# Patient Record
Sex: Male | Born: 1945 | Race: White | Hispanic: No | Marital: Married | Smoking: Never smoker
Health system: Southern US, Community
[De-identification: ages and names within clinical notes are randomized; demographics above are authoritative.]

## PROBLEM LIST (undated history)

## (undated) DIAGNOSIS — E785 Hyperlipidemia, unspecified: Secondary | ICD-10-CM

## (undated) DIAGNOSIS — K219 Gastro-esophageal reflux disease without esophagitis: Secondary | ICD-10-CM

## (undated) DIAGNOSIS — N529 Male erectile dysfunction, unspecified: Secondary | ICD-10-CM

## (undated) DIAGNOSIS — N400 Enlarged prostate without lower urinary tract symptoms: Secondary | ICD-10-CM

## (undated) DIAGNOSIS — I10 Essential (primary) hypertension: Secondary | ICD-10-CM

## (undated) DIAGNOSIS — E119 Type 2 diabetes mellitus without complications: Secondary | ICD-10-CM

## (undated) HISTORY — DX: Benign prostatic hyperplasia without lower urinary tract symptoms: N40.0

## (undated) HISTORY — DX: Hyperlipidemia, unspecified: E78.5

## (undated) HISTORY — DX: Type 2 diabetes mellitus without complications: E11.9

## (undated) HISTORY — DX: Gastro-esophageal reflux disease without esophagitis: K21.9

## (undated) HISTORY — DX: Essential (primary) hypertension: I10

## (undated) HISTORY — DX: Male erectile dysfunction, unspecified: N52.9

---

## 2013-05-02 ENCOUNTER — Ambulatory Visit: Payer: Self-pay

## 2013-05-09 ENCOUNTER — Ambulatory Visit: Payer: Self-pay

## 2013-05-16 ENCOUNTER — Ambulatory Visit: Payer: Self-pay

## 2013-05-20 ENCOUNTER — Encounter: Payer: Medicare Other | Attending: Family Medicine | Admitting: *Deleted

## 2013-05-20 ENCOUNTER — Encounter: Payer: Self-pay | Admitting: *Deleted

## 2013-05-20 VITALS — Ht 70.0 in | Wt 208.9 lb

## 2013-05-20 DIAGNOSIS — E119 Type 2 diabetes mellitus without complications: Secondary | ICD-10-CM | POA: Insufficient documentation

## 2013-05-20 DIAGNOSIS — IMO0001 Reserved for inherently not codable concepts without codable children: Secondary | ICD-10-CM

## 2013-05-20 DIAGNOSIS — Z713 Dietary counseling and surveillance: Secondary | ICD-10-CM | POA: Insufficient documentation

## 2013-05-20 NOTE — Patient Instructions (Signed)
Plan:  Aim for 3 Carb Choices per meal (45 grams) +/- 1 either way  Aim for 0-2 Carbs per snack if hungry  Consider reading food labels for Total Carbohydrate of foods Consider  increasing your activity level for 15-30 minutes daily as tolerated Continue checking BG 1- 2 times a week and consider after meals on occasion

## 2013-05-20 NOTE — Progress Notes (Signed)
Appt start time: 0930 end time:  1100.  Assessment:  Patient was seen on  05/20/13 for individual diabetes education. Strong family history for diabetes from both parents. 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 Patient's goal weight is 190 pounds. He is working in [place] and works 7 AM to 7 PM Monday through Friday. He prepares his own meals during the week and comes home to [place] on weekends.  Current HbA1c: 6.5% on 01/13/14  Preferred Learning Style: all of the following  Auditory  Visual  Hands on  Learning Readiness:   Ready  Change in progress  MEDICATIONS: see list  DIETARY INTAKE:  24-hr recall:  B ( AM): when working out of town: large bowl of unsweet cereal, 2% milk and coffee with half and half. When at home: eggs, [place] sausage, occasionally french toast, is avoiding OJ now,  Snk ( AM): Velveeta crackers, coffee and half and half L ( PM): 15 grain bread for a sandwich with lean meat, swiss cheese and mustard, sliced tomato, Crystal LIght Snk ( PM): fresh fruit D ( PM): when working out of town: [employer] whole can, 8-12 triscuits, swiss cheese, OR full meal when back at home with lean meat, vegetables and starch along with salads and low fat dressing, wine with dinner 3-4 nights a week or Crystal Light Snk ( PM): he makes sugar free pudding x 8 oz Beverages: coffee, Crystal Light and wine  Usual physical activity: 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  Discussed diabetes disease process and treatment options.  Discussed physiology of diabetes and role of obesity on insulin resistance.  Encouraged moderate weight reduction to improve glucose levels.  Discussed role of medications and diet in glucose control  Provided education on macronutrients on glucose levels.  Provided  education on carb counting, importance of regularly scheduled meals/snacks, and meal planning  Discussed effects of physical activity on glucose levels and long-term glucose control.  Recommended he consider options of physical activity/week.  Discussed blood glucose monitoring and interpretation.  Discussed recommended target ranges and individual ranges.    Described short-term complications: hyper- and hypo-glycemia.  Discussed causes,symptoms, and treatment options.  Discussed prevention, detection, and treatment of long-term complications.  Discussed the role of prolonged elevated glucose levels on body systems.  Discussed role of stress on blood glucose levels and discussed strategies to manage psychosocial issues.  Provided information on recommendations for long-term diabetes self-care.  Established checklist for medical, dental, and emotional self-care.  Plan:  Aim for 3 Carb Choices per meal (45 grams) +/- 1 either way  Aim for 0-2 Carbs per snack if hungry  Consider reading food labels for Total Carbohydrate of foods Consider  increasing your activity level for 15-30 minutes daily as tolerated Continue checking BG 1- 2 times a week and consider after meals on occasion   Teaching Method Utilized: Visual, Auditory and Hands on  Handouts given during visit include: Living Well with Diabetes Carb Counting and Food Label handouts Meal Plan Card  Barriers to learning/adherence to lifestyle change: strict work schedule right now  Diabetes self-care support plan:   [facility] support group  Demonstrated degree of understanding via:  Teach Back   Monitoring/Evaluation:  Dietary intake, exercise, reading food labels, and body weight prn.

## 2019-05-09 ENCOUNTER — Other Ambulatory Visit: Payer: Self-pay

## 2019-05-09 ENCOUNTER — Ambulatory Visit: Payer: Medicare Other | Attending: Internal Medicine

## 2019-05-09 DIAGNOSIS — Z23 Encounter for immunization: Secondary | ICD-10-CM

## 2019-05-09 NOTE — Progress Notes (Signed)
   Covid-19 Vaccination Clinic  Name:  Wesley Matthews    MRN: [id number] DOB: 10/23/1945  05/09/2019  Wesley Matthews was observed post Covid-19 immunization for 15 minutes without incidence. He was provided with Vaccine Information Sheet and instruction to access the V-Safe system.   Wesley Matthews was instructed to call 911 with any severe reactions post vaccine: Marland Kitchen Difficulty breathing  . Swelling of your face and throat  . A fast heartbeat  . A bad rash all over your body  . Dizziness and weakness    Immunizations Administered    Name Date Dose VIS Date Route   Pfizer COVID-19 Vaccine 05/09/2019  5:30 PM 0.3 mL 03/29/2019 Intramuscular   Manufacturer: [place]   Lot: [id number]   [place]: [id number]

## 2019-05-30 ENCOUNTER — Ambulatory Visit: Payer: Medicare Other | Attending: Internal Medicine

## 2019-05-30 DIAGNOSIS — Z23 Encounter for immunization: Secondary | ICD-10-CM

## 2019-05-30 NOTE — Progress Notes (Signed)
   Covid-19 Vaccination Clinic  Name:  Wesley Matthews    MRN: [id number] DOB: 02/17/1946  05/30/2019  Wesley Matthews was observed post Covid-19 immunization for 15 minutes without incidence. He was provided with Vaccine Information Sheet and instruction to access the V-Safe system.   Wesley Matthews was instructed to call 911 with any severe reactions post vaccine: Marland Kitchen Difficulty breathing  . Swelling of your face and throat  . A fast heartbeat  . A bad rash all over your body  . Dizziness and weakness    Immunizations Administered    Name Date Dose VIS Date Route   Pfizer COVID-19 Vaccine 05/30/2019  8:45 AM 0.3 mL 03/29/2019 Intramuscular   Manufacturer: [place]   Lot: [id number]   [place] [id number]

## 2020-05-04 DIAGNOSIS — I10 Essential (primary) hypertension: Secondary | ICD-10-CM | POA: Diagnosis not present

## 2020-05-04 DIAGNOSIS — E1169 Type 2 diabetes mellitus with other specified complication: Secondary | ICD-10-CM | POA: Diagnosis not present

## 2020-05-04 DIAGNOSIS — E781 Pure hyperglyceridemia: Secondary | ICD-10-CM | POA: Diagnosis not present

## 2020-05-04 DIAGNOSIS — K219 Gastro-esophageal reflux disease without esophagitis: Secondary | ICD-10-CM | POA: Diagnosis not present

## 2020-05-04 DIAGNOSIS — E1140 Type 2 diabetes mellitus with diabetic neuropathy, unspecified: Secondary | ICD-10-CM | POA: Diagnosis not present

## 2020-05-08 DIAGNOSIS — R000 Tachycardia, unspecified: Secondary | ICD-10-CM | POA: Diagnosis not present

## 2020-05-11 NOTE — Progress Notes (Signed)
Cardiology Office Note:   Date:  05/12/2020  NAME:  Wesley Matthews    MRN: [id number] DOB:  18-Aug-1945   PCP:  Kathyrn Lass, MD  Cardiologist:  No primary care provider on file.   Referring MD: Kathyrn Lass, MD   Chief Complaint  Patient presents with  . Tachycardia   History of Present Illness:   Marshall Kahekili Gorbett is a 75 y.o. male with a hx of DM, HTN who is being seen today for the evaluation of tachycardia/palpitations at the request of Kathyrn Lass, MD.  He recently noticed his heart rate was fast.  He apparently picked this up by checking his blood pressure.  He also checks his pulse.  Pulse has been consistently around 150.  He was seen by his primary care physician and sent for evaluation.  EKG shows atrial flutter with RVR up to 149 bpm.  He has a right bundle branch block pattern as well.  He has no symptoms from this.  No chest pain or shortness of breath or palpitations.  He reports he exercise walking 2 miles daily up to 4 to 5 days/week.  He can play golf any limitations either.  Medical history significant for diabetes which is well controlled.  Most recent LDL cholesterol 40.  Blood pressure has been a bit low well-controlled on current medications.  He denies any chest pain, shortness of breath or palpitations in office.  Medical history is significant for heart disease in both parents.  They were heavy smokers.  He has never had a heart attack or stroke.  No difficulty swallowing.  We reported that he needed a transesophageal echocardiogram and cardioversion.  He is okay to proceed.  He does need updated blood work.  He does not smoke, drink alcohol in excess or use any drugs.  He denies symptoms in office today.  Problem List 1. Diabetes -A1c 6.8 2. Hypertension 3. HLD -Total cholesterol 110, HDL 55, LDL 40, triglycerides 70. 4. Atrial Flutter -typical 05/12/2020 -CHADSVASC= 3 (age, DM, HTN)  Past Medical History: Past Medical History:  Diagnosis Date  . BPH  (benign prostatic hyperplasia)   . Diabetes mellitus without complication ([place])   . ED (erectile dysfunction)   . GERD (gastroesophageal reflux disease)   . Hyperlipidemia   . Hypertension     Past Surgical History: Past Surgical History:  Procedure Laterality Date  . 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 apixaban (ELIQUIS) 5 MG TABS tablet Take 1 tablet (5 mg total) by mouth 2 (two) times daily.  Marland Kitchen atorvastatin (LIPITOR) 10 MG tablet Take 10 mg by mouth daily.  . fluticasone (FLONASE) 50 MCG/ACT nasal spray Place into both nostrils.  . Gluc-Chonn-MSM-Boswellia-Vit D (GLUCOSAMINE CHOND TRIPLE/VIT D) TABS See admin instructions.  Marland Kitchen JARDIANCE 10 MG TABS tablet Take 10 mg by mouth daily.  . Lancets (ONETOUCH ULTRASOFT) lancets   . levocetirizine (XYZAL) 5 MG tablet every evening.  Marland Kitchen losartan (COZAAR) 25 MG tablet Take 25 mg by mouth daily.  . metFORMIN (GLUCOPHAGE) 500 MG tablet Take 1,000 mg by mouth 2 (two) times daily.  . metoprolol tartrate (LOPRESSOR) 25 MG tablet Take 1 tablet (25 mg total) by mouth 2 (two) times daily.  Marland Kitchen omeprazole (PRILOSEC) 20 MG capsule Take 20 mg by mouth daily.  . sildenafil (VIAGRA) 100 MG tablet Take by mouth.  . [DISCONTINUED] amLODipine (NORVASC) 5 MG tablet Take 5 mg by mouth daily.  . [  DISCONTINUED] aspirin 81 MG tablet Take 81 mg by mouth daily.  . [DISCONTINUED] azelastine (ASTELIN) 137 MCG/SPRAY nasal spray Place into both nostrils 2 (two) times daily. Use in each nostril as directed  . [DISCONTINUED] fenofibrate 160 MG tablet Take 160 mg by mouth daily.  . [DISCONTINUED] ranitidine (ZANTAC) 300 MG capsule Take 300 mg by mouth every evening.     Allergies:    Patient has no known allergies.   Social History: Social History   Socioeconomic History  . Marital status: Married    Spouse name: Not on file  . Number of children: 3  . Years of education: Not on file  . Highest education  level: Not on file  Occupational History  . Occupation: Arts administrator  Tobacco Use  . Smoking status: Never Smoker  . Smokeless tobacco: Never Used  Substance and Sexual Activity  . Alcohol use: Yes    Comment: 3-4 glasses wine / week  . Drug use: Never  . Sexual activity: Not on file  Other Topics Concern  . Not on file  Social History Narrative  . Not on file   Social Determinants of Health   Financial Resource Strain: Not on file  Food Insecurity: Not on file  Transportation Needs: Not on file  Physical Activity: Not on file  Stress: Not on file  Social Connections: Not on file    Family History: The patient's family history includes Heart disease in his father and mother.  ROS:   All other ROS reviewed and negative. Pertinent positives noted in the HPI.     EKGs/Labs/Other Studies Reviewed:   The following studies were personally reviewed by me today:  EKG:  EKG is ordered today.  The ekg ordered today demonstrates atrial flutter with 2-1 block, right bundle branch block, and was personally reviewed by me.   Recent Labs: No results found for requested labs within last 8760 hours.   Recent Lipid Panel No results found for: CHOL, TRIG, HDL, CHOLHDL, VLDL, LDLCALC, LDLDIRECT  Physical Exam:   VS:  BP 115/71   Pulse (!) 149   Ht 5\' 10"  (1.778 m)   Wt 190 lb 9.6 oz (86.5 kg)   SpO2 97%   BMI 27.35 kg/m    Wt Readings from Last 3 Encounters:  05/12/20 190 lb 9.6 oz (86.5 kg)  05/20/13 208 lb 14.4 oz (94.8 kg)    General: Well nourished, well developed, in no acute distress Head: Atraumatic, normal size  Eyes: PEERLA, EOMI  Neck: Supple, no JVD Endocrine: No thryomegaly Cardiac: Normal S1, S2; tachycardia noted, regular rhythm neck Lungs: Clear to auscultation bilaterally, no wheezing, rhonchi or rales  Abd: Soft, nontender, no hepatomegaly  Ext: No edema, pulses 2+ Musculoskeletal: No deformities, BUE and BLE strength normal and equal Skin: Warm  and dry, no rashes   Neuro: Alert and oriented to person, place, time, and situation, CNII-XII grossly intact, no focal deficits  Psych: Normal mood and affect   ASSESSMENT:   Wesley Matthews is a 75 y.o. male who presents for the following: 1. Palpitations   2. Tachycardia   3. Typical atrial flutter (HCC)     PLAN:   1. Palpitations 2. Tachycardia 3. Typical atrial flutter ([place]) -He presents with asymptomatic atrial flutter with 2:1 block.  Heart rate 149.  Unclear risk factor. -He needs a CBC, CMP, TSH today. -We will start him on metoprolol tartrate 25 mg twice a day.  He will stop his losartan. -We will  start Eliquis 5 mg twice daily.  We will plan for TEE/cardioversion on Thursday of this week. -CHADSVASC=3 (age, DM, HTN) -We will proceed with an echocardiogram after his TEE/cardioversion.  I would also like him to get a 2-week Zio patch.  We may consider extended monitoring to look for atrial fibrillation.    Shared Decision Making/Informed Consent The risks [stroke, cardiac arrhythmias rarely resulting in the need for a temporary or permanent pacemaker, skin irritation or burns, esophageal damage, perforation (1:10,000 risk), bleeding, pharyngeal hematoma as well as other potential complications associated with conscious sedation including aspiration, arrhythmia, respiratory failure and death], benefits (treatment guidance, restoration of normal sinus rhythm, diagnostic support) and alternatives of a transesophageal echocardiogram guided cardioversion were discussed in detail with Mr. Karstetter and he is willing to proceed.  Disposition: Return in about 2 weeks (around 05/26/2020).  Medication Adjustments/Labs and Tests Ordered: Current medicines are reviewed at length with the patient today.  Concerns regarding medicines are outlined above.  Orders Placed This Encounter  Procedures  . CBC  . Comprehensive metabolic panel  . TSH  . LONG TERM MONITOR (3-14 DAYS)  . EKG  12-Lead  . ECHOCARDIOGRAM COMPLETE   Meds ordered this encounter  Medications  . metoprolol tartrate (LOPRESSOR) 25 MG tablet    Sig: Take 1 tablet (25 mg total) by mouth 2 (two) times daily.    Dispense:  180 tablet    Refill:  3  . apixaban (ELIQUIS) 5 MG TABS tablet    Sig: Take 1 tablet (5 mg total) by mouth 2 (two) times daily.    Dispense:  60 tablet    Refill:  3    Patient Instructions  Medication Instructions:  Start Metoprolol Tartrate 25 daily twice daily  Stop Losartan  Start Eliquis 5 mg twice daily   *If you need a refill on your cardiac medications before your next appointment, please call your pharmacy*   Lab Work: CBC, CMET, TSH COVID TESTING: tomorrow at 8:30 AM- [address], [place]   If you have labs (blood work) drawn today and your tests are completely normal, you will receive your results only by: Marland Kitchen MyChart Message (if you have MyChart) OR . A paper copy in the mail If you have any lab test that is abnormal or we need to change your treatment, we will call you to review the results.   Testing/Procedures:  Your physician has requested that you have a TEE/Cardioversion. During a TEE, sound waves are used to create images of your heart. It provides your doctor with information about the size and shape of your heart and how well your heart's chambers and valves are working. In this test, a transducer is attached to the end of a flexible tube that is guided down you throat and into your esophagus (the tube leading from your mouth to your stomach) to get a more detailed image of your heart. Once the TEE has determined that a blood clot is not present, the cardioversion begins. Electrical Cardioversion uses a jolt of electricity to your heart either through paddles or wired patches attached to your chest. This is a controlled, usually prescheduled, procedure. This procedure is done at the hospital and you are not awake during the procedure. You  usually go home the day of the procedure. Please see the instruction sheet given to you today for more information.  Echocardiogram - Your physician has requested that you have an echocardiogram. Echocardiography is a painless test that uses  sound waves to create images of your heart. It provides your doctor with information about the size and shape of your heart and how well your heart's chambers and valves are working. This procedure takes approximately one hour. There are no restrictions for this procedure. This will be performed at our [facility] location - [address].  ZIO XT- Long Term Monitor Instructions   Your physician has requested you wear your ZIO patch monitor___14____days.   This is a single patch monitor.  [employer] supplies one patch monitor per enrollment.  Additional stickers are not available.   Please do not apply patch if you will be having a Nuclear Stress Test, Echocardiogram, Cardiac CT, MRI, or Chest Xray during the time frame you would be wearing the monitor. The patch cannot be worn during these tests.  You cannot remove and re-apply the ZIO XT patch monitor.   Your ZIO patch monitor will be sent USPS Priority mail from [employer] directly to your home address. The monitor may also be mailed to a PO BOX if home delivery is not available.   It may take 3-5 days to receive your monitor after you have been enrolled.   Once you have received you monitor, please review enclosed instructions.  Your monitor has already been registered assigning a specific monitor serial # to you.   Applying the monitor   Shave hair from upper left chest.   Hold abrader disc by orange tab.  Rub abrader in 40 strokes over left upper chest as indicated in your monitor instructions.   Clean area with 4 enclosed alcohol pads .  Use all pads to assure are is cleaned thoroughly.  Let dry.   Apply patch as indicated in monitor instructions.  Patch will be place under  collarbone on left side of chest with arrow pointing upward.   Rub patch adhesive wings for 2 minutes.Remove white label marked "1".  Remove white label marked "2".  Rub patch adhesive wings for 2 additional minutes.   While looking in a mirror, press and release button in center of patch.  A small green light will flash 3-4 times .  This will be your only indicator the monitor has been turned on.     Do not shower for the first 24 hours.  You may shower after the first 24 hours.   Press button if you feel a symptom. You will hear a small click.  Record Date, Time and Symptom in the Patient Log Book.   When you are ready to remove patch, follow instructions on last 2 pages of Patient Log Book.  Stick patch monitor onto last page of Patient Log Book.   Place Patient Log Book in [place] box.  Use locking tab on box and tape box closed securely.  The [employer] has [employer] on it.  Please place in mailbox as soon as possible.  Your physician should have your test results approximately 7 days after the monitor has been mailed back to [facility]   Call [place] at [phone] if you have questions regarding your ZIO XT patch monitor.  Call them immediately if you see an orange light blinking on your monitor.   If your monitor falls off in less than 4 days contact our Monitor department at [phone].  If your monitor becomes loose or falls off after 4 days call [employer] at [phone] for suggestions on securing your monitor.     Follow-Up:  At [facility], you and your health needs are our priority.  As part of our continuing mission to provide you with exceptional heart care, we have created designated Provider Care Teams.  These Care Teams include your primary Cardiologist (physician) and Advanced Practice Providers (APPs -  Physician Assistants and Nurse Practitioners) who all work together to provide you with the care you need, when you need  it.  We recommend signing up for the patient portal called "MyChart".  Sign up information is provided on this After Visit Summary.  MyChart is used to connect with patients for Virtual Visits (Telemedicine).  Patients are able to view lab/test results, encounter notes, upcoming appointments, etc.  Non-urgent messages can be sent to your provider as well.   To learn more about what you can do with MyChart, go to [email or web address].    Your next appointment:   2 week(s)  The format for your next appointment:   In Person  Provider:   Eleonore Chiquito, MD   Other Instructions  You are scheduled for a TEE/Cardioversion/TEE Cardioversion on 01/27 with Dr. Stanford Breed.  Please arrive at the [facility] (Main Entrance A) at [facility]: [address] [place], [place] [address] at 8:00 am. (1 hour prior to procedure unless lab work is needed; if lab work is needed arrive 1.5 hours ahead)  DIET: Nothing to eat or drink after midnight except a sip of water with medications (see medication instructions below)  Medication Instructions: Hold Jardiance, and Metformin the day of Tee/Cardioversion   Continue your anticoagulant: Eliquis You will need to continue your anticoagulant after your procedure until you  are told by your  Provider that it is safe to stop   Labs: CBC, BMET today   You must have a responsible person to drive you home and stay in the waiting area during your procedure. Failure to do so could result in cancellation.  Bring your insurance cards.  *Special Note: Every effort is made to have your procedure done on time. Occasionally there are emergencies that occur at the hospital that may cause delays. Please be patient if a delay does occur.       Signed, Addison Naegeli. Audie Box, [place]  [address] [place], [place] [address] [phone]  05/12/2020 3:40 PM

## 2020-05-12 ENCOUNTER — Ambulatory Visit: Payer: Medicare Other | Admitting: Cardiovascular Disease

## 2020-05-12 ENCOUNTER — Ambulatory Visit (INDEPENDENT_AMBULATORY_CARE_PROVIDER_SITE_OTHER): Payer: Medicare Other

## 2020-05-12 ENCOUNTER — Encounter: Payer: Self-pay | Admitting: Cardiovascular Disease

## 2020-05-12 ENCOUNTER — Other Ambulatory Visit: Payer: Self-pay

## 2020-05-12 VITALS — BP 115/71 | HR 149 | Ht 70.0 in | Wt 190.6 lb

## 2020-05-12 DIAGNOSIS — R000 Tachycardia, unspecified: Secondary | ICD-10-CM

## 2020-05-12 DIAGNOSIS — R002 Palpitations: Secondary | ICD-10-CM

## 2020-05-12 DIAGNOSIS — I483 Typical atrial flutter: Secondary | ICD-10-CM | POA: Diagnosis not present

## 2020-05-12 MED ORDER — APIXABAN 5 MG PO TABS: 5.0000 mg | ORAL_TABLET | Freq: Two times a day (BID) | ORAL | 3 refills | Status: DC

## 2020-05-12 MED ORDER — METOPROLOL TARTRATE 25 MG PO TABS: 25.0000 mg | ORAL_TABLET | Freq: Two times a day (BID) | ORAL | 3 refills | Status: DC

## 2020-05-12 NOTE — Patient Instructions (Signed)
Medication Instructions:  Start Metoprolol Tartrate 25 daily twice daily  Stop Losartan  Start Eliquis 5 mg twice daily   *If you need a refill on your cardiac medications before your next appointment, please call your pharmacy*   Lab Work: CBC, CMET, TSH COVID TESTING: tomorrow at 8:30 AM- [place] [address], [place]   If you have labs (blood work) drawn today and your tests are completely normal, you will receive your results only by: Marland Kitchen MyChart Message (if you have MyChart) OR . A paper copy in the mail If you have any lab test that is abnormal or we need to change your treatment, we will call you to review the results.   Testing/Procedures:  Your physician has requested that you have a TEE/Cardioversion. During a TEE, sound waves are used to create images of your heart. It provides your doctor with information about the size and shape of your heart and how well your heart's chambers and valves are working. In this test, a transducer is attached to the end of a flexible tube that is guided down you throat and into your esophagus (the tube leading from your mouth to your stomach) to get a more detailed image of your heart. Once the TEE has determined that a blood clot is not present, the cardioversion begins. Electrical Cardioversion uses a jolt of electricity to your heart either through paddles or wired patches attached to your chest. This is a controlled, usually prescheduled, procedure. This procedure is done at the hospital and you are not awake during the procedure. You usually go home the day of the procedure. Please see the instruction sheet given to you today for more information.  Echocardiogram - Your physician has requested that you have an echocardiogram. Echocardiography is a painless test that uses sound waves to create images of your heart. It provides your doctor with information about the size and shape of your heart and how well your heart's chambers and valves are  working. This procedure takes approximately one hour. There are no restrictions for this procedure. This will be performed at our [facility] location - [address].  ZIO XT- Long Term Monitor Instructions   Your physician has requested you wear your ZIO patch monitor___14____days.   This is a single patch monitor.  [employer] supplies one patch monitor per enrollment.  Additional stickers are not available.   Please do not apply patch if you will be having a Nuclear Stress Test, Echocardiogram, Cardiac CT, MRI, or Chest Xray during the time frame you would be wearing the monitor. The patch cannot be worn during these tests.  You cannot remove and re-apply the ZIO XT patch monitor.   Your ZIO patch monitor will be sent USPS Priority mail from [facility] directly to your home address. The monitor may also be mailed to a PO BOX if home delivery is not available.   It may take 3-5 days to receive your monitor after you have been enrolled.   Once you have received you monitor, please review enclosed instructions.  Your monitor has already been registered assigning a specific monitor serial # to you.   Applying the monitor   Shave hair from upper left chest.   Hold abrader disc by orange tab.  Rub abrader in 40 strokes over left upper chest as indicated in your monitor instructions.   Clean area with 4 enclosed alcohol pads .  Use all pads to assure are is cleaned thoroughly.  Let dry.   Apply patch as indicated in monitor instructions.  Patch will be place under collarbone on left side of chest with arrow pointing upward.   Rub patch adhesive wings for 2 minutes.Remove white label marked "1".  Remove white label marked "2".  Rub patch adhesive wings for 2 additional minutes.   While looking in a mirror, press and release button in center of patch.  A small green light will flash 3-4 times .  This will be your only indicator the monitor has been turned on.     Do not  shower for the first 24 hours.  You may shower after the first 24 hours.   Press button if you feel a symptom. You will hear a small click.  Record Date, Time and Symptom in the Patient Log Book.   When you are ready to remove patch, follow instructions on last 2 pages of Patient Log Book.  Stick patch monitor onto last page of Patient Log Book.   Place Patient Log Book in [place] box.  Use locking tab on box and tape box closed securely.  The [employer] has [employer] on it.  Please place in mailbox as soon as possible.  Your physician should have your test results approximately 7 days after the monitor has been mailed back to [facility].   Call [place] at [phone] if you have questions regarding your ZIO XT patch monitor.  Call them immediately if you see an orange light blinking on your monitor.   If your monitor falls off in less than 4 days contact our Monitor department at [phone].  If your monitor becomes loose or falls off after 4 days call [employer] at [phone] for suggestions on securing your monitor.     Follow-Up: At [facility], you and your health needs are our priority.  As part of our continuing mission to provide you with exceptional heart care, we have created designated Provider Care Teams.  These Care Teams include your primary Cardiologist (physician) and Advanced Practice Providers (APPs -  Physician Assistants and Nurse Practitioners) who all work together to provide you with the care you need, when you need it.  We recommend signing up for the patient portal called "MyChart".  Sign up information is provided on this After Visit Summary.  MyChart is used to connect with patients for Virtual Visits (Telemedicine).  Patients are able to view lab/test results, encounter notes, upcoming appointments, etc.  Non-urgent messages can be sent to your provider as well.   To learn more about what you can do with MyChart, go to  [email or web address].    Your next appointment:   2 week(s)  The format for your next appointment:   In Person  Provider:   Eleonore Chiquito, MD   Other Instructions  You are scheduled for a TEE/Cardioversion/TEE Cardioversion on 01/27 with Dr. Stanford Breed.  Please arrive at the [facility] (Main Entrance A) at [facility]: [address] [place], [place] [address] at 8:00 am. (1 hour prior to procedure unless lab work is needed; if lab work is needed arrive 1.5 hours ahead)  DIET: Nothing to eat or drink after midnight except a sip of water with medications (see medication instructions below)  Medication Instructions: Hold Jardiance, and Metformin the day of Tee/Cardioversion   Continue your anticoagulant: Eliquis You will need to continue your anticoagulant after your procedure until you  are told by your  Provider that it is safe  to stop   Labs: CBC, BMET today   You must have a responsible person to drive you home and stay in the waiting area during your procedure. Failure to do so could result in cancellation.  Bring your insurance cards.  *Special Note: Every effort is made to have your procedure done on time. Occasionally there are emergencies that occur at the hospital that may cause delays. Please be patient if a delay does occur.

## 2020-05-13 ENCOUNTER — Other Ambulatory Visit (HOSPITAL_COMMUNITY)
Admission: RE | Admit: 2020-05-13 | Discharge: 2020-05-13 | Disposition: A | Discharge status: Home / Self Care | Payer: Medicare Other | Source: Ambulatory Visit | Attending: Cardiovascular Disease | Admitting: Cardiovascular Disease

## 2020-05-13 ENCOUNTER — Encounter (HOSPITAL_COMMUNITY): Payer: Self-pay | Admitting: Cardiology

## 2020-05-13 DIAGNOSIS — Z01812 Encounter for preprocedural laboratory examination: Secondary | ICD-10-CM | POA: Insufficient documentation

## 2020-05-13 DIAGNOSIS — Z20822 Contact with and (suspected) exposure to covid-19: Secondary | ICD-10-CM | POA: Insufficient documentation

## 2020-05-13 LAB — COMPREHENSIVE METABOLIC PANEL
ALT: 20 IU/L (ref 0–44)
AST: 19 IU/L (ref 0–40)
Albumin/Globulin Ratio: 1.6 (ref 1.2–2.2)
Albumin: 4.8 g/dL — ABNORMAL HIGH (ref 3.7–4.7)
Alkaline Phosphatase: 72 IU/L (ref 44–121)
BUN/Creatinine Ratio: 20 (ref 10–24)
BUN: 19 mg/dL (ref 8–27)
Bilirubin Total: 0.3 mg/dL (ref 0.0–1.2)
CO2: 25 mmol/L (ref 20–29)
Calcium: 10.2 mg/dL (ref 8.6–10.2)
Chloride: 100 mmol/L (ref 96–106)
Creatinine, Ser: 0.96 mg/dL (ref 0.76–1.27)
GFR calc Af Amer: 90 mL/min/{1.73_m2} (ref >59)
GFR calc non Af Amer: 78 mL/min/{1.73_m2} (ref >59)
Globulin, Total: 3 g/dL (ref 1.5–4.5)
Glucose: 108 mg/dL — ABNORMAL HIGH (ref 65–99)
Potassium: 5.1 mmol/L (ref 3.5–5.2)
Sodium: 140 mmol/L (ref 134–144)
Total Protein: 7.8 g/dL (ref 6.0–8.5)

## 2020-05-13 LAB — CBC
Hematocrit: 48.5 % (ref 37.5–51.0)
Hemoglobin: 16.2 g/dL (ref 13.0–17.7)
MCH: 29.8 pg (ref 26.6–33.0)
MCHC: 33.4 g/dL (ref 31.5–35.7)
MCV: 89 fL (ref 79–97)
Platelets: 267 10*3/uL (ref 150–450)
RBC: 5.43 x10E6/uL (ref 4.14–5.80)
RDW: 12.9 % (ref 11.6–15.4)
WBC: 5.7 10*3/uL (ref 3.4–10.8)

## 2020-05-13 LAB — TSH: TSH: 3.97 u[IU]/mL (ref 0.450–4.500)

## 2020-05-13 LAB — SARS CORONAVIRUS 2 (TAT 6-24 HRS): SARS Coronavirus 2: NEGATIVE

## 2020-05-14 ENCOUNTER — Encounter (HOSPITAL_COMMUNITY): Payer: Self-pay | Admitting: Cardiology

## 2020-05-14 ENCOUNTER — Other Ambulatory Visit: Payer: Self-pay

## 2020-05-14 ENCOUNTER — Encounter (HOSPITAL_COMMUNITY)
Admission: RE | Disposition: A | Discharge status: Home / Self Care | Payer: Self-pay | Source: Home / Self Care | Attending: Cardiology

## 2020-05-14 ENCOUNTER — Ambulatory Visit (HOSPITAL_COMMUNITY): Payer: Medicare Other | Admitting: Certified Registered Nurse Anesthetist

## 2020-05-14 ENCOUNTER — Ambulatory Visit (HOSPITAL_COMMUNITY)
Admission: RE | Admit: 2020-05-14 | Discharge: 2020-05-14 | Disposition: A | Discharge status: Home / Self Care | Payer: Medicare Other | Attending: Cardiology | Admitting: Cardiology

## 2020-05-14 ENCOUNTER — Ambulatory Visit (HOSPITAL_BASED_OUTPATIENT_CLINIC_OR_DEPARTMENT_OTHER)
Admission: RE | Admit: 2020-05-14 | Discharge: 2020-05-14 | Disposition: A | Discharge status: Home / Self Care | Payer: Medicare Other | Source: Home / Self Care | Attending: Cardiovascular Disease | Admitting: Cardiovascular Disease

## 2020-05-14 DIAGNOSIS — Z79899 Other long term (current) drug therapy: Secondary | ICD-10-CM | POA: Insufficient documentation

## 2020-05-14 DIAGNOSIS — I700 Atherosclerosis of aorta: Secondary | ICD-10-CM | POA: Insufficient documentation

## 2020-05-14 DIAGNOSIS — I340 Nonrheumatic mitral (valve) insufficiency: Secondary | ICD-10-CM

## 2020-05-14 DIAGNOSIS — R002 Palpitations: Secondary | ICD-10-CM | POA: Diagnosis not present

## 2020-05-14 DIAGNOSIS — I4892 Unspecified atrial flutter: Secondary | ICD-10-CM

## 2020-05-14 DIAGNOSIS — R000 Tachycardia, unspecified: Secondary | ICD-10-CM | POA: Diagnosis not present

## 2020-05-14 DIAGNOSIS — Z7901 Long term (current) use of anticoagulants: Secondary | ICD-10-CM | POA: Insufficient documentation

## 2020-05-14 DIAGNOSIS — Z7984 Long term (current) use of oral hypoglycemic drugs: Secondary | ICD-10-CM | POA: Insufficient documentation

## 2020-05-14 DIAGNOSIS — E119 Type 2 diabetes mellitus without complications: Secondary | ICD-10-CM | POA: Diagnosis not present

## 2020-05-14 DIAGNOSIS — I10 Essential (primary) hypertension: Secondary | ICD-10-CM | POA: Insufficient documentation

## 2020-05-14 DIAGNOSIS — I4891 Unspecified atrial fibrillation: Secondary | ICD-10-CM | POA: Diagnosis not present

## 2020-05-14 HISTORY — PX: TEE WITHOUT CARDIOVERSION: SHX5443

## 2020-05-14 HISTORY — PX: CARDIOVERSION: SHX1299

## 2020-05-14 LAB — GLUCOSE, CAPILLARY: Glucose-Capillary: 112 mg/dL — ABNORMAL HIGH (ref 70–99)

## 2020-05-14 SURGERY — ECHOCARDIOGRAM, TRANSESOPHAGEAL
Anesthesia: Monitor Anesthesia Care

## 2020-05-14 MED ORDER — PHENYLEPHRINE 40 MCG/ML (10ML) SYRINGE FOR IV PUSH (FOR BLOOD PRESSURE SUPPORT)
PREFILLED_SYRINGE | INTRAVENOUS | Status: DC | PRN
  Administered 2020-05-14 (×2): 80 ug via INTRAVENOUS
  Administered 2020-05-14: 120 ug via INTRAVENOUS
  Administered 2020-05-14: 80 ug via INTRAVENOUS

## 2020-05-14 MED ORDER — PROPOFOL 500 MG/50ML IV EMUL
INTRAVENOUS | Status: DC | PRN
  Administered 2020-05-14: 75 ug/kg/min via INTRAVENOUS

## 2020-05-14 MED ORDER — PROPOFOL 10 MG/ML IV BOLUS
INTRAVENOUS | Status: DC | PRN
  Administered 2020-05-14 (×3): 20 mg via INTRAVENOUS

## 2020-05-14 MED ORDER — LIDOCAINE 2% (20 MG/ML) 5 ML SYRINGE
INTRAMUSCULAR | Status: DC | PRN
  Administered 2020-05-14: 60 mg via INTRAVENOUS

## 2020-05-14 MED ORDER — SODIUM CHLORIDE 0.9 % IV SOLN: INTRAVENOUS | Status: DC

## 2020-05-14 NOTE — Progress Notes (Signed)
  Echocardiogram 2D Echocardiogram has been performed.  Jennette Dubin 05/14/2020, 9:57 AM

## 2020-05-14 NOTE — Anesthesia Preprocedure Evaluation (Signed)
Anesthesia Evaluation  Patient identified by MRN, date of birth, ID band Patient awake    Reviewed: Allergy & Precautions, NPO status , Patient's Chart, lab work & pertinent test results, reviewed documented beta blocker date and time   Airway Mallampati: II  TM Distance: >3 FB Neck ROM: Full    Dental  (+) Teeth Intact   Pulmonary neg pulmonary ROS,    Pulmonary exam normal        Cardiovascular hypertension, Pt. on medications and Pt. on home beta blockers + dysrhythmias Atrial Fibrillation  Rhythm:Irregular Rate:Normal     Neuro/Psych negative neurological ROS  negative psych ROS   GI/Hepatic Neg liver ROS, GERD  Medicated and Controlled,  Endo/Other  diabetes, Well Controlled, Type 2, Oral Hypoglycemic Agents  Renal/GU negative Renal ROS   BPH    Musculoskeletal negative musculoskeletal ROS (+)   Abdominal (+)  Abdomen: soft. Bowel sounds: normal.  Peds  Hematology negative hematology ROS (+)   Anesthesia Other Findings   Reproductive/Obstetrics                             Anesthesia Physical Anesthesia Plan  ASA: III  Anesthesia Plan: General   Post-op Pain Management:    Induction: Intravenous  PONV Risk Score and Plan: 2 and Propofol infusion and Treatment may vary due to age or medical condition  Airway Management Planned: Simple Face Mask, Natural Airway and Nasal Cannula  Additional Equipment: None  Intra-op Plan:   Post-operative Plan:   Informed Consent: I have reviewed the patients History and Physical, chart, labs and discussed the procedure including the risks, benefits and alternatives for the proposed anesthesia with the patient or authorized representative who has indicated his/her understanding and acceptance.     Dental advisory given  Plan Discussed with: CRNA  Anesthesia Plan Comments: (Lab Results      Component                Value                Date                      WBC                      5.7                 05/12/2020                HGB                      16.2                05/12/2020                HCT                      48.5                05/12/2020                MCV                      89                  05/12/2020  PLT                      267                 05/12/2020           Lab Results      Component                Value               Date                      NA                       140                 05/12/2020                K                        5.1                 05/12/2020                CO2                      25                  05/12/2020                GLUCOSE                  108 (H)             05/12/2020                BUN                      19                  05/12/2020                CREATININE               0.96                05/12/2020                CALCIUM                  10.2                05/12/2020                GFRNONAA                 78                  05/12/2020                GFRAA                    90                  05/12/2020          )        Anesthesia Quick Evaluation

## 2020-05-14 NOTE — Anesthesia Postprocedure Evaluation (Signed)
Anesthesia Post Note  Patient: Carmen Jahmai Finelli  Procedure(s) Performed: TRANSESOPHAGEAL ECHOCARDIOGRAM (TEE) (N/A ) CARDIOVERSION (N/A )     Patient location during evaluation: Endoscopy Anesthesia Type: MAC and General Level of consciousness: awake and alert Pain management: pain level controlled Vital Signs Assessment: post-procedure vital signs reviewed and stable Respiratory status: spontaneous breathing, nonlabored ventilation, respiratory function stable and patient connected to nasal cannula oxygen Cardiovascular status: blood pressure returned to baseline and stable Postop Assessment: no apparent nausea or vomiting Anesthetic complications: no   No complications documented.  Last Vitals:  Vitals:   05/14/20 1010 05/14/20 1018  BP: (!) 88/65 96/61  Pulse: 75 78  Resp: (!) 9 13  Temp:    SpO2: 95% 94%    Last Pain:  Vitals:   05/14/20 1018  TempSrc:   PainSc: 0-No pain                 Belenda Cruise P Rutledge Selsor

## 2020-05-14 NOTE — Interval H&P Note (Signed)
History and Physical Interval Note:  05/14/2020 8:07 AM  Wesley Matthews  has presented today for surgery, with the diagnosis of a fib.  The various methods of treatment have been discussed with the patient and family. After consideration of risks, benefits and other options for treatment, the patient has consented to  Procedure(s): TRANSESOPHAGEAL ECHOCARDIOGRAM (TEE) (N/A) CARDIOVERSION (N/A) as a surgical intervention.  The patient's history has been reviewed, patient examined, no change in status, stable for surgery.  I have reviewed the patient's chart and labs.  Questions were answered to the patient's satisfaction.     Kirk Ruths

## 2020-05-14 NOTE — Progress Notes (Addendum)
    Transesophageal Echocardiogram Note  Wesley Matthews [id number] Aug 28, 1945  Procedure: Transesophageal Echocardiogram Indications: Atrial flutter  Procedure Details Consent: Obtained Time Out: Verified patient identification, verified procedure, site/side was marked, verified correct patient position, special equipment/implants available, Radiology Safety Procedures followed,  medications/allergies/relevent history reviewed, required imaging and test results available.  Performed  Medications:  Pt sedated by anesthesia with lidocaine  60 mg and diprovan  220 mg IV total.  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  Pt subsequently underwent DCCV with 120J to NSR; no immediate complications; continue apixaban.   Complications: No apparent complications Patient did tolerate procedure well.  Kirk Ruths, MD

## 2020-05-14 NOTE — H&P (Signed)
Office Visit  05/12/2020 [facility] Elwin Sleight, MD  Cardiology  Palpitations +2 more  Dx  Tachycardia; Referred by Kathyrn Lass, MD  Reason for Visit    Additional Documentation  Vitals:  BP 115/71  Pulse 149Important   Ht 5\' 10"  (1.778 m)  Wt 86.5 kg  SpO2 97%  BMI 27.35 kg/m  BSA 2.07 m    More Vitals  Flowsheets:  MEWS Score,  Anthropometrics,  NEWS    Encounter Info:  Billing Info,  History,  Allergies,  Detailed Report     All Notes   Progress Notes by Geralynn Rile, MD at 05/12/2020 1:20 PM  Author: Geralynn Rile, MD Author Type: Physician Filed: 05/12/2020 3:41 PM  Note Status: Signed Cosign: Cosign Not Required Encounter Date: 05/12/2020  Editor: Geralynn Rile, MD (Physician)             Expand AllCollapse All    Cardiology Office Note:   Date:  05/12/2020  NAME:  Wesley Matthews                                     MRN:   [id number] DOB:  1945-12-01           PCP:  Kathyrn Lass, MD             Cardiologist:  No primary care provider on file.   Referring MD: Kathyrn Lass, MD      Chief Complaint  Patient presents with  . Tachycardia   History of Present Illness:   Wesley Matthews is a 75 y.o. male with a hx of DM, HTN who is being seen today for the evaluation of tachycardia/palpitations at the request of Kathyrn Lass, MD.  He recently noticed his heart rate was fast.  He apparently picked this up by checking his blood pressure.  He also checks his pulse.  Pulse has been consistently around 150.  He was seen by his primary care physician and sent for evaluation.  EKG shows atrial flutter with RVR up to 149 bpm.  He has a right bundle branch block pattern as well.  He has no symptoms from this.  No chest pain or shortness of breath or palpitations.  He reports he exercise walking 2 miles daily up to 4 to 5 days/week.  He can play golf any limitations either.  Medical history  significant for diabetes which is well controlled.  Most recent LDL cholesterol 40.  Blood pressure has been a bit low well-controlled on current medications.  He denies any chest pain, shortness of breath or palpitations in office.  Medical history is significant for heart disease in both parents.  They were heavy smokers.  He has never had a heart attack or stroke.  No difficulty swallowing.  We reported that he needed a transesophageal echocardiogram and cardioversion.  He is okay to proceed.  He does need updated blood work.  He does not smoke, drink alcohol in excess or use any drugs.  He denies symptoms in office today.  Problem List 1. Diabetes -A1c 6.8 2. Hypertension 3. HLD -Total cholesterol 110, HDL 55, LDL 40, triglycerides 70. 4. Atrial Flutter -typical 05/12/2020 -CHADSVASC= 3 (age, DM, HTN)  Past Medical History:     Past Medical History:  Diagnosis Date  . BPH (benign prostatic hyperplasia)   . Diabetes mellitus without complication ([place])   .  ED (erectile dysfunction)   . GERD (gastroesophageal reflux disease)   . Hyperlipidemia   . Hypertension     Past Surgical History:      Past Surgical History:  Procedure Laterality Date  . 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 apixaban (ELIQUIS) 5 MG TABS tablet Take 1 tablet (5 mg total) by mouth 2 (two) times daily.  Marland Kitchen atorvastatin (LIPITOR) 10 MG tablet Take 10 mg by mouth daily.  . fluticasone (FLONASE) 50 MCG/ACT nasal spray Place into both nostrils.  . Gluc-Chonn-MSM-Boswellia-Vit D (GLUCOSAMINE CHOND TRIPLE/VIT D) TABS See admin instructions.  Marland Kitchen JARDIANCE 10 MG TABS tablet Take 10 mg by mouth daily.  . Lancets (ONETOUCH ULTRASOFT) lancets   . levocetirizine (XYZAL) 5 MG tablet every evening.  Marland Kitchen losartan (COZAAR) 25 MG tablet Take 25 mg by mouth daily.  . metFORMIN (GLUCOPHAGE) 500 MG tablet Take 1,000 mg by mouth 2 (two)  times daily.  . metoprolol tartrate (LOPRESSOR) 25 MG tablet Take 1 tablet (25 mg total) by mouth 2 (two) times daily.  Marland Kitchen omeprazole (PRILOSEC) 20 MG capsule Take 20 mg by mouth daily.  . sildenafil (VIAGRA) 100 MG tablet Take by mouth.  . [DISCONTINUED] amLODipine (NORVASC) 5 MG tablet Take 5 mg by mouth daily.  . [DISCONTINUED] aspirin 81 MG tablet Take 81 mg by mouth daily.  . [DISCONTINUED] azelastine (ASTELIN) 137 MCG/SPRAY nasal spray Place into both nostrils 2 (two) times daily. Use in each nostril as directed  . [DISCONTINUED] fenofibrate 160 MG tablet Take 160 mg by mouth daily.  . [DISCONTINUED] ranitidine (ZANTAC) 300 MG capsule Take 300 mg by mouth every evening.       Allergies:    Patient has no known allergies.   Social History: Social History        Socioeconomic History  . Marital status: Married    Spouse name: Not on file  . Number of children: 3  . Years of education: Not on file  . Highest education level: Not on file  Occupational History  . Occupation: Arts administrator  Tobacco Use  . Smoking status: Never Smoker  . Smokeless tobacco: Never Used  Substance and Sexual Activity  . Alcohol use: Yes    Comment: 3-4 glasses wine / week  . Drug use: Never  . Sexual activity: Not on file  Other Topics Concern  . Not on file  Social History Narrative  . Not on file   Social Determinants of Health   Financial Resource Strain: Not on file  Food Insecurity: Not on file  Transportation Needs: Not on file  Physical Activity: Not on file  Stress: Not on file  Social Connections: Not on file    Family History: The patient's family history includes Heart disease in his father and mother.  ROS:   All other ROS reviewed and negative. Pertinent positives noted in the HPI.     EKGs/Labs/Other Studies Reviewed:   The following studies were personally reviewed by me today:  EKG:  EKG is ordered today.  The ekg ordered today demonstrates  atrial flutter with 2-1 block, right bundle branch block, and was personally reviewed by me.   Recent Labs: No results found for requested labs within last 8760 hours.   Recent Lipid Panel Labs (Brief)  No results found for: CHOL, TRIG, HDL, CHOLHDL, VLDL, LDLCALC, LDLDIRECT    Physical Exam:  VS:  BP 115/71   Pulse (!) 149   Ht 5\' 10"  (1.778 m)   Wt 190 lb 9.6 oz (86.5 kg)   SpO2 97%   BMI 27.35 kg/m       Wt Readings from Last 3 Encounters:  05/12/20 190 lb 9.6 oz (86.5 kg)  05/20/13 208 lb 14.4 oz (94.8 kg)    General: Well nourished, well developed, in no acute distress Head: Atraumatic, normal size  Eyes: PEERLA, EOMI  Neck: Supple, no JVD Endocrine: No thryomegaly Cardiac: Normal S1, S2; tachycardia noted, regular rhythm neck Lungs: Clear to auscultation bilaterally, no wheezing, rhonchi or rales  Abd: Soft, nontender, no hepatomegaly  Ext: No edema, pulses 2+ Musculoskeletal: No deformities, BUE and BLE strength normal and equal Skin: Warm and dry, no rashes   Neuro: Alert and oriented to person, place, time, and situation, CNII-XII grossly intact, no focal deficits  Psych: Normal mood and affect   ASSESSMENT:   Wesley Matthews is a 75 y.o. male who presents for the following: 1. Palpitations   2. Tachycardia   3. Typical atrial flutter (HCC)     PLAN:   1. Palpitations 2. Tachycardia 3. Typical atrial flutter ([place]) -He presents with asymptomatic atrial flutter with 2:1 block.  Heart rate 149.  Unclear risk factor. -He needs a CBC, CMP, TSH today. -We will start him on metoprolol tartrate 25 mg twice a day.  He will stop his losartan. -We will start Eliquis 5 mg twice daily.  We will plan for TEE/cardioversion on Thursday of this week. -CHADSVASC=3 (age, DM, HTN) -We will proceed with an echocardiogram after his TEE/cardioversion.  I would also like him to get a 2-week Zio patch.  We may consider extended monitoring to look for atrial  fibrillation.    Shared Decision Making/Informed Consent The risks [stroke, cardiac arrhythmias rarely resulting in the need for a temporary or permanent pacemaker, skin irritation or burns, esophageal damage, perforation (1:10,000 risk), bleeding, pharyngeal hematoma as well as other potential complications associated with conscious sedation including aspiration, arrhythmia, respiratory failure and death], benefits (treatment guidance, restoration of normal sinus rhythm, diagnostic support) and alternatives of a transesophageal echocardiogram guided cardioversion were discussed in detail with Mr. Buggy and he is willing to proceed.  Disposition: Return in about 2 weeks (around 05/26/2020).  Medication Adjustments/Labs and Tests Ordered: Current medicines are reviewed at length with the patient today.  Concerns regarding medicines are outlined above.     Orders Placed This Encounter  Procedures  . CBC  . Comprehensive metabolic panel  . TSH  . LONG TERM MONITOR (3-14 DAYS)  . EKG 12-Lead  . ECHOCARDIOGRAM COMPLETE       Meds ordered this encounter  Medications  . metoprolol tartrate (LOPRESSOR) 25 MG tablet    Sig: Take 1 tablet (25 mg total) by mouth 2 (two) times daily.    Dispense:  180 tablet    Refill:  3  . apixaban (ELIQUIS) 5 MG TABS tablet    Sig: Take 1 tablet (5 mg total) by mouth 2 (two) times daily.    Dispense:  60 tablet    Refill:  3    Patient Instructions  Medication Instructions:  Start Metoprolol Tartrate 25 daily twice daily  Stop Losartan  Start Eliquis 5 mg twice daily   *If you need a refill on your cardiac medications before your next appointment, please call your pharmacy*   Lab Work: CBC, CMET, TSH COVID TESTING: tomorrow at 8:30  AM- [facility] [address], [place]   If you have labs (blood work) drawn today and your tests are completely normal, you will receive your results only by:  [place] (if you have  MyChart) OR  A paper copy in the mail If you have any lab test that is abnormal or we need to change your treatment, we will call you to review the results.   Testing/Procedures:  Your physician has requested that you have a TEE/Cardioversion. During a TEE, sound waves are used to create images of your heart. It provides your doctor with information about the size and shape of your heart and how well your heart's chambers and valves are working. In this test, a transducer is attached to the end of a flexible tube that is guided down you throat and into your esophagus (the tube leading from your mouth to your stomach) to get a more detailed image of your heart. Once the TEE has determined that a blood clot is not present, the cardioversion begins. Electrical Cardioversion uses a jolt of electricity to your heart either through paddles or wired patches attached to your chest. This is a controlled, usually prescheduled, procedure. This procedure is done at the hospital and you are not awake during the procedure. You usually go home the day of the procedure. Please see the instruction sheet given to you today for more information.  Echocardiogram- Your physician has requested that you have an echocardiogram. Echocardiography is a painless test that uses sound waves to create images of your heart. It provides your doctor with information about the size and shape of your heart and how well your heart's chambers and valves are working. This procedure takes approximately one hour. There are no restrictions for this procedure.This will be performed at our [facility] location - [address].  ZIO XT- Long Term Monitor Instructions   Your physician has requested you wear your ZIO patch monitor___14____days.   This is a single patch monitor. [employer] supplies one patch monitor per enrollment. Additional stickers are not available.   Please do not apply patch if you will be having a  Nuclear Stress Test, Echocardiogram, Cardiac CT, MRI, or Chest Xray during the time frame you would be wearing the monitor. The patch cannot be worn during these tests. You cannot remove and re-apply the ZIO XT patch monitor.   Your ZIO patch monitor will be sent USPS Priority mail from [facility] directly to your home address. The monitor may also be mailed to a PO BOX if home delivery is not available.  It may take 3-5 days to receive your monitor after you have been enrolled.   Once you have received you monitor, please review enclosed instructions. Your monitor has already been registered assigning a specific monitor serial # to you.   Applying the monitor   Shave hair from upper left chest.   Hold abrader disc by orange tab. Rub abrader in 40 strokes over left upper chest as indicated in your monitor instructions.   Clean area with 4 enclosed alcohol pads . Use all pads to assure are is cleaned thoroughly. Let dry.   Apply patch as indicated in monitor instructions. Patch will be place under collarbone on left side of chest with arrow pointing upward.   Rub patch adhesive wings for 2 minutes.Remove white label marked "1". Remove white label marked "2". Rub patch adhesive wings for 2 additional minutes.   While looking in a mirror,  press and release button in center of patch. A small green light will flash 3-4 times . This will be your only indicator the monitor has been turned on.    Do not shower for the first 24 hours. You may shower after the first 24 hours.   Press button if you feel a symptom. You will hear a small click. Record Date, Time and Symptom in the Patient Log Book.   When you are ready to remove patch, follow instructions on last 2 pages of Patient Log Book. Stick patch monitor onto last page of Patient Log Book.   Place Patient Log Book in [place] box. Use locking tab on box and tape box closed securely. The [employer] has [employer] on it. Please place in mailbox as soon as possible. Your physician should have your test results approximately 7 days after the monitor has been mailed back to [facility].   Call [place] at [phone] if you have questions regarding your ZIO XT patch monitor. Call them immediately if you see an orange light blinking on your monitor.   If your monitor falls off in less than 4 days contact our Monitor department at [phone]. If your monitor becomes loose or falls off after 4 days call [employer] at [phone] for suggestions on securing your monitor.     Follow-Up: At [facility], you and your health needs are our priority. As part of our continuing mission to provide you with exceptional heart care, we have created designated Provider Care Teams. These Care Teams include your primary Cardiologist (physician) and Advanced Practice Providers (APPs -  Physician Assistants and Nurse Practitioners) who all work together to provide you with the care you need, when you need it.  We recommend signing up for the patient portal called "MyChart".  Sign up information is provided on this After Visit Summary.  MyChart is used to connect with patients for Virtual Visits (Telemedicine).  Patients are able to view lab/test results, encounter notes, upcoming appointments, etc.  Non-urgent messages can be sent to your provider as well.   To learn more about what you can do with MyChart, go to [email or web address].    Your next appointment:   2 week(s)  The format for your next appointment:   In Person  Provider:   Eleonore Chiquito, MD   Other Instructions  You are scheduled for a TEE/Cardioversion/TEE Cardioversion on 01/27 with Dr. Stanford Breed.  Please arrive at the [facility] (Main Entrance A) at [facility]: [address] [place], [place] [address] at 8:00 am. (1 hour prior to procedure unless lab work is needed; if lab work is needed  arrive 1.5 hours ahead)  DIET: Nothing to eat or drink after midnight except a sip of water with medications (see medication instructions below)  Medication Instructions: Hold Jardiance, and Metformin the day of Tee/Cardioversion   Continue your anticoagulant: Eliquis You will need to continue your anticoagulant after your procedure until you            are told by your  Provider that it is safe to stop   Labs: CBC, BMET today   You must have a responsible person to drive you home and stay in the waiting area during your procedure. Failure to do so could result in cancellation.  Bring your insurance cards.  *Special Note: Every effort is made to have your procedure done on time. Occasionally there are emergencies that occur at the hospital that  may cause delays. Please be patient if a delay does occur.       Signed, Addison Naegeli. Audie Box, [place]  [address] [place], [place] [address] [phone]  05/12/2020 3:40 PM           For TEE/DCCV; on apixaban; no changes Kirk Ruths

## 2020-05-14 NOTE — Transfer of Care (Signed)
Immediate Anesthesia Transfer of Care Note  Patient: Wesley Matthews  Procedure(s) Performed: TRANSESOPHAGEAL ECHOCARDIOGRAM (TEE) (N/A ) CARDIOVERSION (N/A )  Patient Location: PACU and Endoscopy Unit  Anesthesia Type:General  Level of Consciousness: drowsy  Airway & Oxygen Therapy: Patient Spontanous Breathing  Post-op Assessment: Report given to RN and Post -op Vital signs reviewed and stable  Post vital signs: Reviewed and stable  Last Vitals:  Vitals Value Taken Time  BP    Temp    Pulse    Resp    SpO2      Last Pain:  Vitals:   05/14/20 0830  TempSrc: Oral  PainSc: 0-No pain         Complications: No complications documented.

## 2020-05-14 NOTE — Anesthesia Procedure Notes (Signed)
Procedure Name: MAC Date/Time: 05/14/2020 8:55 AM Performed by: Trinna Post., CRNA Pre-anesthesia Checklist: Patient identified, Emergency Drugs available, Suction available, Patient being monitored and Timeout performed Patient Re-evaluated:Patient Re-evaluated prior to induction Oxygen Delivery Method: Nasal cannula Preoxygenation: Pre-oxygenation with 100% oxygen Induction Type: IV induction Placement Confirmation: positive ETCO2

## 2020-05-14 NOTE — Discharge Instructions (Signed)
Transesophageal Echocardiogram Transesophageal echocardiogram (TEE) is a test that uses sound waves to take pictures of your heart. TEE is done by passing a small probe attached to a flexible tube down the part of the body that moves food from your mouth to your stomach (esophagus). The pictures give clear images of your heart. This can help your doctor see if there are problems with your heart. Tell a doctor about:  Any allergies you have.  All medicines you are taking. This includes vitamins, herbs, eye drops, creams, and over-the-counter medicines.  Any problems you or family members have had with anesthetic medicines.  Any blood disorders you have.  Any surgeries you have had.  Any medical conditions you have.  Any swallowing problems.  Whether you have or have had a blockage in the part of the body that moves food from your mouth to your stomach.  Whether you are pregnant or may be pregnant. What are the risks? In general, this is a safe procedure. But, problems may occur, such as:  Damage to nearby structures or organs.  A tear in the part of the body that moves food from your mouth to your stomach.  Irregular heartbeat.  Hoarse voice or trouble swallowing.  Bleeding. What happens before the procedure? Medicines  Ask your doctor about changing or stopping: ? Your normal medicines. ? Vitamins, herbs, and supplements. ? Over-the-counter medicines.  Do not take aspirin or ibuprofen unless you are told to. General instructions  Follow instructions from your doctor about what you cannot eat or drink.  You will take out any dentures or dental retainers.  Plan to have a responsible adult take you home from the hospital or clinic.  Plan to have a responsible adult care for you for the time you are told after you leave the hospital or clinic. This is important. What happens during the procedure?  An IV will be put into one of your veins.  You may be given: ? A  sedative. This medicine helps you relax. ? A medicine to numb the back of your throat. This may be sprayed or gargled.  Your blood pressure, heart rate, and breathing will be watched.  You may be asked to lie on your left side.  A bite block will be placed in your mouth. This keeps you from biting the tube.  The tip of the probe will be placed into the back of your mouth.  You will be asked to swallow.  Your doctor will take pictures of your heart.  The probe and bite block will be taken out after the test is done. The procedure may vary among doctors and hospitals.   What can I expect after the procedure?  You will be monitored until you leave the hospital or clinic. This includes checking your blood pressure, heart rate, breathing rate, and blood oxygen level.  Your throat may feel sore and numb. This will get better over time. You will not be allowed to eat or drink until the numbness has gone away.  It is common to have a sore throat for a day or two.  It is up to you to get the results of your procedure. Ask how to get your results when they are ready. Follow these instructions at home:  If you were given a sedative during your procedure, do not drive or use machines until your doctor says that it is safe.  Return to your normal activities when your doctor says that it is safe.    Keep all follow-up visits. Summary  TEE is a test that uses sound waves to take pictures of your heart.  You will be given a medicine to help you relax.  Do not drive or use machines until your doctor says that it is safe. This information is not intended to replace advice given to you by your health care provider. Make sure you discuss any questions you have with your health care provider. Document Revised: 11/26/2019 Document Reviewed: 11/26/2019 Elsevier Patient Education  2021 Elsevier Inc. Electrical Cardioversion Electrical cardioversion is the delivery of a jolt of electricity to  restore a normal rhythm to the heart. A rhythm that is too fast or is not regular keeps the heart from pumping well. In this procedure, sticky patches or metal paddles are placed on the chest to deliver electricity to the heart from a device. This procedure may be done in an emergency if:  There is low or no blood pressure as a result of the heart rhythm.  Normal rhythm must be restored as fast as possible to protect the brain and heart from further damage.  It may save a life. This may also be a scheduled procedure for irregular or fast heart rhythms that are not immediately life-threatening. Tell a health care provider about:  Any allergies you have.  All medicines you are taking, including vitamins, herbs, eye drops, creams, and over-the-counter medicines.  Any problems you or family members have had with anesthetic medicines.  Any blood disorders you have.  Any surgeries you have had.  Any medical conditions you have.  Whether you are pregnant or may be pregnant. What are the risks? Generally, this is a safe procedure. However, problems may occur, including:  Allergic reactions to medicines.  A blood clot that breaks free and travels to other parts of your body.  The possible return of an abnormal heart rhythm within hours or days after the procedure.  Your heart stopping (cardiac arrest). This is rare. What happens before the procedure? Medicines  Your health care provider may have you start taking: ? Blood-thinning medicines (anticoagulants) so your blood does not clot as easily. ? Medicines to help stabilize your heart rate and rhythm.  Ask your health care provider about: ? Changing or stopping your regular medicines. This is especially important if you are taking diabetes medicines or blood thinners. ? Taking medicines such as aspirin and ibuprofen. These medicines can thin your blood. Do not take these medicines unless your health care provider tells you to take  them. ? Taking over-the-counter medicines, vitamins, herbs, and supplements. General instructions  Follow instructions from your health care provider about eating or drinking restrictions.  Plan to have someone take you home from the hospital or clinic.  If you will be going home right after the procedure, plan to have someone with you for 24 hours.  Ask your health care provider what steps will be taken to help prevent infection. These may include washing your skin with a germ-killing soap. What happens during the procedure?  An IV will be inserted into one of your veins.  Sticky patches (electrodes) or metal paddles may be placed on your chest.  You will be given a medicine to help you relax (sedative).  An electrical shock will be delivered. The procedure may vary among health care providers and hospitals.   What can I expect after the procedure?  Your blood pressure, heart rate, breathing rate, and blood oxygen level will be monitored until you leave   the hospital or clinic.  Your heart rhythm will be watched to make sure it does not change.  You may have some redness on the skin where the shocks were given. Follow these instructions at home:  Do not drive for 24 hours if you were given a sedative during your procedure.  Take over-the-counter and prescription medicines only as told by your health care provider.  Ask your health care provider how to check your pulse. Check it often.  Rest for 48 hours after the procedure or as told by your health care provider.  Avoid or limit your caffeine use as told by your health care provider.  Keep all follow-up visits as told by your health care provider. This is important. Contact a health care provider if:  You feel like your heart is beating too quickly or your pulse is not regular.  You have a serious muscle cramp that does not go away. Get help right away if:  You have discomfort in your chest.  You are dizzy or you  feel faint.  You have trouble breathing or you are short of breath.  Your speech is slurred.  You have trouble moving an arm or leg on one side of your body.  Your fingers or toes turn cold or blue. Summary  Electrical cardioversion is the delivery of a jolt of electricity to restore a normal rhythm to the heart.  This procedure may be done right away in an emergency or may be a scheduled procedure if the condition is not an emergency.  Generally, this is a safe procedure.  After the procedure, check your pulse often as told by your health care provider. This information is not intended to replace advice given to you by your health care provider. Make sure you discuss any questions you have with your health care provider. Document Revised: 11/05/2018 Document Reviewed: 11/05/2018 Elsevier Patient Education  2021 Elsevier Inc.  

## 2020-05-15 ENCOUNTER — Encounter (HOSPITAL_COMMUNITY): Payer: Self-pay | Admitting: Cardiology

## 2020-05-18 ENCOUNTER — Other Ambulatory Visit (HOSPITAL_COMMUNITY): Payer: Medicare Other

## 2020-05-20 ENCOUNTER — Encounter (HOSPITAL_COMMUNITY): Admission: RE | Payer: Self-pay | Source: Home / Self Care

## 2020-05-20 ENCOUNTER — Ambulatory Visit (HOSPITAL_COMMUNITY): Admission: RE | Admit: 2020-05-20 | Payer: Medicare Other | Source: Home / Self Care | Admitting: Cardiovascular Disease

## 2020-05-20 SURGERY — ECHOCARDIOGRAM, TRANSESOPHAGEAL
Anesthesia: Monitor Anesthesia Care

## 2020-05-26 ENCOUNTER — Other Ambulatory Visit: Payer: Self-pay

## 2020-05-26 ENCOUNTER — Ambulatory Visit (HOSPITAL_COMMUNITY): Payer: Medicare Other | Attending: Internal Medicine

## 2020-05-26 DIAGNOSIS — I483 Typical atrial flutter: Secondary | ICD-10-CM | POA: Insufficient documentation

## 2020-05-27 ENCOUNTER — Encounter: Payer: Self-pay | Admitting: Cardiovascular Disease

## 2020-05-27 ENCOUNTER — Ambulatory Visit: Payer: Medicare Other | Admitting: Cardiovascular Disease

## 2020-05-27 VITALS — BP 110/68 | HR 145 | Ht 70.0 in | Wt 192.6 lb

## 2020-05-27 DIAGNOSIS — I483 Typical atrial flutter: Secondary | ICD-10-CM

## 2020-05-27 LAB — ECHOCARDIOGRAM COMPLETE
Area-P 1/2: 2.05 cm2
S' Lateral: 3.3 cm

## 2020-05-27 MED ORDER — DIGOXIN 125 MCG PO TABS: 0.1250 mg | ORAL_TABLET | Freq: Every day | ORAL | 3 refills | Status: DC

## 2020-05-27 MED ORDER — METOPROLOL TARTRATE 25 MG PO TABS: 25.0000 mg | ORAL_TABLET | Freq: Two times a day (BID) | ORAL | 3 refills | Status: DC

## 2020-05-27 MED ORDER — APIXABAN 5 MG PO TABS: 5.0000 mg | ORAL_TABLET | Freq: Two times a day (BID) | ORAL | 3 refills | Status: DC

## 2020-05-27 NOTE — Patient Instructions (Signed)
Medication Instructions:  Start Digoxin 0.125 mg daily   *If you need a refill on your cardiac medications before your next appointment, please call your pharmacy*  Follow-Up: At [facility], you and your health needs are our priority.  As part of our continuing mission to provide you with exceptional heart care, we have created designated Provider Care Teams.  These Care Teams include your primary Cardiologist (physician) and Advanced Practice Providers (APPs -  Physician Assistants and Nurse Practitioners) who all work together to provide you with the care you need, when you need it.  We recommend signing up for the patient portal called "MyChart".  Sign up information is provided on this After Visit Summary.  MyChart is used to connect with patients for Virtual Visits (Telemedicine).  Patients are able to view lab/test results, encounter notes, upcoming appointments, etc.  Non-urgent messages can be sent to your provider as well.   To learn more about what you can do with MyChart, go to [email or web address].    Your next appointment:   2 month(s)  The format for your next appointment:   In Person  Provider:   Eleonore Chiquito, MD

## 2020-05-27 NOTE — Progress Notes (Signed)
Cardiology Office Note:   Date:  05/27/2020  NAME:  Wesley Matthews    MRN: [id number] DOB:  26-May-1945   PCP:  Kathyrn Lass, MD  Cardiologist:  No primary care provider on file.  Electrophysiologist:  None   Referring MD: Kathyrn Lass, MD   Chief Complaint  Patient presents with  . Atrial Flutter   History of Present Illness:   Wesley Matthews is a 75 y.o. male with a hx of atrial flutter, diabetes, hyperlipidemia who presents for follow-up.  Was evaluated 2 weeks ago for atrial flutter.  Sent for TEE/cardioversion on 05/14/2020.  Converted back to sinus rhythm but now has recurrence of atrial flutter.  Noticed while checking his blood pressure this morning.  BP 110/68.  Heart rate and EKG in office demonstrates 149 bpm.  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  Appears to be typical atrial flutter again.  No symptoms from this.  No chest pain or trouble breathing.  Echocardiogram yesterday while he was in normal sinus rhythm demonstrated normal LV function.  His BPs were soft this morning that he had no symptoms.  We discussed he needs to proceed with ablation.  He is okay to do so.  I discussed his case with Dr. Lars Mage.  Plan for evaluation early next week.  He is wearing a heart monitor.  We have plans to monitor him for atrial fibrillation but appears he will not be able to complete this.  Problem List 1. Diabetes -A1c 6.8 2. Hypertension 3. HLD -Total cholesterol 110, HDL 55, LDL 40, triglycerides 70. 4. Atrial Flutter -typical 05/12/2020 -CHADSVASC= 3 (age, DM, HTN) -TEE/DCCV 05/14/2020  Past Medical History: Past Medical History:  Diagnosis Date  . BPH (benign prostatic hyperplasia)   . Diabetes mellitus without complication ([place])   . ED (erectile dysfunction)   . GERD (gastroesophageal reflux disease)   . Hyperlipidemia   . Hypertension     Past Surgical History: Past Surgical History:  Procedure Laterality Date   . 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 CARDIOVERSION N/A 05/14/2020   Procedure: CARDIOVERSION;  Surgeon: Lelon Perla, MD;  Location: [facility] ENDOSCOPY;  Service: Cardiovascular;  Laterality: N/A;  . TEE WITHOUT CARDIOVERSION N/A 05/14/2020   Procedure: TRANSESOPHAGEAL ECHOCARDIOGRAM (TEE);  Surgeon: Lelon Perla, MD;  Location[facility] ENDOSCOPY;  Service: Cardiovascular;  Laterality: N/A;    Current Medications: Current Meds  Medication Sig  . atorvastatin (LIPITOR) 10 MG tablet Take 10 mg by mouth daily.  . digoxin (LANOXIN) 0.125 MG tablet Take 1 tablet (0.125 mg total) by mouth daily.  . fluticasone (FLONASE) 50 MCG/ACT nasal spray Place 1 spray into both nostrils 2 (two) times daily.  . Gluc-Chonn-MSM-Boswellia-Vit D (GLUCOSAMINE CHOND TRIPLE/VIT D) TABS Take 2 tablets by mouth at bedtime.  Marland Kitchen ibuprofen (ADVIL) 200 MG tablet Take 200 mg by mouth every 6 (six) hours as needed for mild pain or moderate pain.  Marland Kitchen JARDIANCE 10 MG TABS tablet Take 10 mg by mouth daily.  . Lancets (ONETOUCH ULTRASOFT) lancets   . levocetirizine (XYZAL) 5 MG tablet Take 5 mg by mouth every evening.  . metFORMIN (GLUCOPHAGE) 500 MG tablet Take 1,000 mg by mouth 2 (two) times daily.  Marland Kitchen omeprazole (PRILOSEC) 20 MG capsule Take 20 mg by mouth daily.  . sildenafil (VIAGRA) 100 MG tablet Take 50 mg by mouth as needed for erectile dysfunction.  . [DISCONTINUED] apixaban (ELIQUIS) 5 MG TABS tablet Take 1 tablet (  5 mg total) by mouth 2 (two) times daily.  . [DISCONTINUED] metoprolol tartrate (LOPRESSOR) 25 MG tablet Take 1 tablet (25 mg total) by mouth 2 (two) times daily.    Allergies:    Catering manager   Social History: Social History   Socioeconomic History  . Marital status: Married    Spouse name: Not on file  . Number of children: 3  . Years of education: Not on file  . Highest education level: Not on file  Occupational History  . Occupation: Arts administrator  Tobacco Use  . Smoking status: Never  Smoker  . Smokeless tobacco: Never Used  Substance and Sexual Activity  . Alcohol use: Yes    Comment: 3-4 glasses wine / week  . Drug use: Never  . Sexual activity: Not on file  Other Topics Concern  . Not on file  Social History Narrative  . Not on file   Social Determinants of Health   Financial Resource Strain: Not on file  Food Insecurity: Not on file  Transportation Needs: Not on file  Physical Activity: Not on file  Stress: Not on file  Social Connections: Not on file    Family History: The patient's family history includes Heart disease in his father and mother.  ROS:   All other ROS reviewed and negative. Pertinent positives noted in the HPI.     EKGs/Labs/Other Studies Reviewed:   The following studies were personally reviewed by me today:  EKG:  EKG is ordered today.  The ekg ordered today demonstrates atrial flutter heart rate 149, right bundle branch block, left anterior fascicular block, and was personally reviewed by me.   TTE 05/26/2020 1. Left ventricular ejection fraction, by estimation, is 60 to 65%. The  left ventricle has normal function. The left ventricle has no regional  wall motion abnormalities. There is mild left ventricular hypertrophy.  Left ventricular diastolic parameters  are consistent with Grade I diastolic dysfunction (impaired relaxation).  2. Right ventricular systolic function is normal. The right ventricular  size is normal.  3. The mitral valve is grossly normal. Trivial mitral valve  regurgitation.  4. The aortic valve is tricuspid. Aortic valve regurgitation is not  visualized. Mild aortic valve sclerosis is present, with no evidence of  aortic valve stenosis.  5. The inferior vena cava is normal in size with greater than 50%  respiratory variability, suggesting right atrial pressure of 3 mmHg.   Recent Labs: 05/12/2020: ALT 20; BUN 19; Creatinine, Ser 0.96; Hemoglobin 16.2; Platelets 267; Potassium 5.1; Sodium 140; TSH  3.970   Recent Lipid Panel No results found for: CHOL, TRIG, HDL, CHOLHDL, VLDL, LDLCALC, LDLDIRECT  Physical Exam:   VS:  BP 110/68   Pulse (!) 145   Ht 5\' 10"  (1.778 m)   Wt 192 lb 9.6 oz (87.4 kg)   SpO2 99%   BMI 27.64 kg/m    Wt Readings from Last 3 Encounters:  05/27/20 192 lb 9.6 oz (87.4 kg)  05/14/20 190 lb 9.6 oz (86.5 kg)  05/12/20 190 lb 9.6 oz (86.5 kg)    General: Well nourished, well developed, in no acute distress Head: Atraumatic, normal size  Eyes: PEERLA, EOMI  Neck: Supple, no JVD Endocrine: No thryomegaly Cardiac: Normal S1, S2; tachycardia noted, no murmurs rubs or gallops Lungs: Clear to auscultation bilaterally, no wheezing, rhonchi or rales  Abd: Soft, nontender, no hepatomegaly  Ext: No edema, pulses 2+ Musculoskeletal: No deformities, BUE and BLE strength normal and equal Skin: Warm  and dry, no rashes   Neuro: Alert and oriented to person, place, time, and situation, CNII-XII grossly intact, no focal deficits  Psych: Normal mood and affect   ASSESSMENT:   Wesley Matthews is a 75 y.o. male who presents for the following: 1. Typical atrial flutter (HCC)     PLAN:   1. Typical atrial flutter (HCC) -typical 05/12/2020 -CHADSVASC= 3 (age, DM, HTN) -TEE/DCCV 05/14/2020 -EF normal.  Thyroid studies normal.  No concerns for sleep apnea. -Back in atrial flutter today.  No symptoms from this.  He has failed cardioversion.  Remains on Eliquis 5 mg twice daily.  I discussed his case with Dr. Lars Mage.  We have plans to monitor him to look for atrial fibrillation but it appears he will not be able to complete his monitor.  I recommend he be evaluated early next week for an atrial flutter ablation.  His blood pressure is soft and had some soft blood pressures this morning.  No symptoms.  EF was normal yesterday when in sinus rhythm.  I recommend he continue his metoprolol tartrate 25 twice a day.  We will add digoxin 0.125 mg daily just to get him  through the weekend.  His blood pressures have been soft and I do not think you will tolerate more aggressive rate control strategies.  Digoxin will just be short-term until he can undergo an ablation.  If he converts he may be considered for extended monitoring to look for atrial fibrillation.  We will see how he does.  He was given strict instructions should he develop chest pain, shortness of breath, dizziness, lightheadedness or any syncopal events that he should proceed to the emergency room.  Cardiology also will be on call in case that is needed.  Disposition: Return in about 2 months (around 07/25/2020).  Medication Adjustments/Labs and Tests Ordered: Current medicines are reviewed at length with the patient today.  Concerns regarding medicines are outlined above.  Orders Placed This Encounter  Procedures  . Ambulatory referral to Cardiac Electrophysiology  . EKG 12-Lead   Meds ordered this encounter  Medications  . digoxin (LANOXIN) 0.125 MG tablet    Sig: Take 1 tablet (0.125 mg total) by mouth daily.    Dispense:  90 tablet    Refill:  3  . apixaban (ELIQUIS) 5 MG TABS tablet    Sig: Take 1 tablet (5 mg total) by mouth 2 (two) times daily.    Dispense:  60 tablet    Refill:  3  . metoprolol tartrate (LOPRESSOR) 25 MG tablet    Sig: Take 1 tablet (25 mg total) by mouth 2 (two) times daily.    Dispense:  180 tablet    Refill:  3    Patient Instructions  Medication Instructions:  Start Digoxin 0.125 mg daily   *If you need a refill on your cardiac medications before your next appointment, please call your pharmacy*  Follow-Up: At [facility], you and your health needs are our priority.  As part of our continuing mission to provide you with exceptional heart care, we have created designated Provider Care Teams.  These Care Teams include your primary Cardiologist (physician) and Advanced Practice Providers (APPs -  Physician Assistants and Nurse Practitioners) who all work  together to provide you with the care you need, when you need it.  We recommend signing up for the patient portal called "MyChart".  Sign up information is provided on this After Visit Summary.  MyChart is used to  connect with patients for Virtual Visits (Telemedicine).  Patients are able to view lab/test results, encounter notes, upcoming appointments, etc.  Non-urgent messages can be sent to your provider as well.   To learn more about what you can do with MyChart, go to [email or web address].    Your next appointment:   2 month(s)  The format for your next appointment:   In Person  Provider:   Eleonore Chiquito, MD    Time Spent with Patient: I have spent a total of 25 minutes with patient reviewing hospital notes, telemetry, EKGs, labs and examining the patient as well as establishing an assessment and plan that was discussed with the patient.  > 50% of time was spent in direct patient care.  Signed, Addison Naegeli. Audie Box, [place]  [address] [place], [place] [address] [phone]  05/27/2020 3:16 PM

## 2020-05-28 ENCOUNTER — Ambulatory Visit: Payer: Medicare Other | Admitting: Cardiovascular Disease

## 2020-06-02 ENCOUNTER — Ambulatory Visit: Payer: Medicare Other | Admitting: Cardiology

## 2020-06-02 ENCOUNTER — Encounter: Payer: Self-pay | Admitting: Cardiology

## 2020-06-02 ENCOUNTER — Other Ambulatory Visit: Payer: Self-pay

## 2020-06-02 VITALS — BP 114/74 | HR 147 | Ht 70.0 in | Wt 193.0 lb

## 2020-06-02 DIAGNOSIS — I483 Typical atrial flutter: Secondary | ICD-10-CM

## 2020-06-02 NOTE — Patient Instructions (Addendum)
Medication Instructions:  Your physician recommends that you continue on your current medications as directed. Please refer to the Current Medication list given to you today.  Labwork: None ordered.  Testing/Procedures: None ordered.  Follow-Up:  SEE INSTRUCTION LETTER  Any Other Special Instructions Will Be Listed Below (If Applicable).  If you need a refill on your cardiac medications before your next appointment, please call your pharmacy.     Cardiac electrophysiology: From cell to bedside (7th ed., pp. 1239-1252). [place], [place]: Elsevier.">  Cardiac Ablation Cardiac ablation is a procedure to destroy, or ablate, a small amount of heart tissue in very specific places. The heart has many electrical connections. Sometimes these connections are abnormal and can cause the heart to beat very fast or irregularly. Ablating some of the areas that cause problems can improve the heart's rhythm or return it to normal. Ablation may be done for people who:  Have Wolff-Parkinson-White syndrome.  Have fast heart rhythms (tachycardia).  Have taken medicines for an abnormal heart rhythm (arrhythmia) that were not effective or caused side effects.  Have a high-risk heartbeat that may be life-threatening. During the procedure, a small incision is made in the neck or the groin, and a long, thin tube (catheter) is inserted into the incision and moved to the heart. Small devices (electrodes) on the tip of the catheter will send out electrical currents. A type of X-ray (fluoroscopy) will be used to help guide the catheter and to provide images of the heart. Tell a health care provider about:  Any allergies you have.  All medicines you are taking, including vitamins, herbs, eye drops, creams, and over-the-counter medicines.  Any problems you or family members have had with anesthetic medicines.  Any blood disorders you have.  Any surgeries you have had.  Any medical conditions you have,  such as kidney failure.  Whether you are pregnant or may be pregnant. What are the risks? Generally, this is a safe procedure. However, problems may occur, including:  Infection.  Bruising and bleeding at the catheter insertion site.  Bleeding into the chest, especially into the sac that surrounds the heart. This is a serious complication.  Stroke or blood clots.  Damage to nearby structures or organs.  Allergic reaction to medicines or dyes.  Need for a permanent pacemaker if the normal electrical system is damaged. A pacemaker is a small computer that sends electrical signals to the heart and helps your heart beat normally.  The procedure not being fully effective. This may not be recognized until months later. Repeat ablation procedures are sometimes done. What happens before the procedure? Medicines Ask your health care provider about:  Changing or stopping your regular medicines. This is especially important if you are taking diabetes medicines or blood thinners.  Taking medicines such as aspirin and ibuprofen. These medicines can thin your blood. Do not take these medicines unless your health care provider tells you to take them.  Taking over-the-counter medicines, vitamins, herbs, and supplements. General instructions  Follow instructions from your health care provider about eating or drinking restrictions.  Plan to have someone take you home from the hospital or clinic.  If you will be going home right after the procedure, plan to have someone with you for 24 hours.  Ask your health care provider what steps will be taken to prevent infection. What happens during the procedure?  An IV will be inserted into one of your veins.  You will be given a medicine to help you relax (  sedative).  The skin on your neck or groin will be numbed.  An incision will be made in your neck or your groin.  A needle will be inserted through the incision and into a large vein in your  neck or groin.  A catheter will be inserted into the needle and moved to your heart.  Dye may be injected through the catheter to help your surgeon see the area of the heart that needs treatment.  Electrical currents will be sent from the catheter to ablate heart tissue in desired areas. There are three types of energy that may be used to do this: ? Heat (radiofrequency energy). ? Laser energy. ? Extreme cold (cryoablation).  When the tissue has been ablated, the catheter will be removed.  Pressure will be held on the insertion area to prevent a lot of bleeding.  A bandage (dressing) will be placed over the insertion area. The exact procedure may vary among health care providers and hospitals.   What happens after the procedure?  Your blood pressure, heart rate, breathing rate, and blood oxygen level will be monitored until you leave the hospital or clinic.  Your insertion area will be monitored for bleeding. You will need to lie still for a few hours to ensure that you do not bleed from the insertion area.  Do not drive for 24 hours or as long as told by your health care provider. Summary  Cardiac ablation is a procedure to destroy, or ablate, a small amount of heart tissue using an electrical current. This procedure can improve the heart rhythm or return it to normal.  Tell your health care provider about any medical conditions you may have and all medicines you are taking to treat them.  This is a safe procedure, but problems may occur. Problems may include infection, bruising, damage to nearby organs or structures, or allergic reactions to medicines.  Follow your health care provider's instructions about eating and drinking before the procedure. You may also be told to change or stop some of your medicines.  After the procedure, do not drive for 24 hours or as long as told by your health care provider. This information is not intended to replace advice given to you by your  health care provider. Make sure you discuss any questions you have with your health care provider. Document Revised: 02/11/2019 Document Reviewed: 02/11/2019 Elsevier Patient Education  2021 Elsevier Inc.      

## 2020-06-02 NOTE — Progress Notes (Signed)
Electrophysiology Office Note:    Date:  06/02/2020   ID:  Wesley Matthews, DOB Jul 20, 1945, MRN [id number]  PCP:  Kathyrn Lass, MD  [facility] Cardiologist:  No primary care provider on file.  [facility] Electrophysiologist:  Vickie Epley, MD   Referring MD: Geralynn Rile, *   Chief Complaint: Atrial flutter  History of Present Illness:    Wesley Matthews is a 75 y.o. male who presents for an evaluation of atrial flutter at the request of Dr. Audie Box. Their medical history includes diabetes, GERD, hypertension and hyperlipidemia.  Patient was last seen by Dr. Audie Box on May 27, 2020 for atrial flutter.  He has previously been cardioverted with TEE on May 14, 2020.  He was successfully cardioverted to sinus rhythm but at the time of the appoint with Dr. Audie Box on for overnight, was back in atrial flutter.  He has rapid ventricular rates while in atrial flutter because of 2-1 conduction.  His CHA2DS2-VASc is 3 for diabetes, age and hypertension.  He is maintained on Eliquis without bleeding issues.  He is very active.  He plays golf and walks routinely.  He tells me he really does not appreciate any symptoms with his atrial flutter.  He was first picked up on in the fall of last year on his blood pressure cuff.  He tells me that he frequently minimizes symptoms and that he may have some episodes of lightheadedness that may be attributable to the atrial flutter.   Past Medical History:  Diagnosis Date  . BPH (benign prostatic hyperplasia)   . Diabetes mellitus without complication ([place])   . ED (erectile dysfunction)   . GERD (gastroesophageal reflux disease)   . Hyperlipidemia   . Hypertension     Past Surgical History:  Procedure Laterality Date  . 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 CARDIOVERSION N/A 05/14/2020   Procedure: CARDIOVERSION;  Surgeon: Lelon Perla, MD;  Location: [facility] ENDOSCOPY;  Service: Cardiovascular;  Laterality: N/A;  . TEE  WITHOUT CARDIOVERSION N/A 05/14/2020   Procedure: TRANSESOPHAGEAL ECHOCARDIOGRAM (TEE);  Surgeon: Lelon Perla, MD;  Location: [facility] ENDOSCOPY;  Service: Cardiovascular;  Laterality: N/A;    Current Medications: No outpatient medications have been marked as taking for the 06/02/20 encounter (Office Visit) with Vickie Epley, MD.     Allergies:   Fire ant   Social History   Socioeconomic History  . Marital status: Married    Spouse name: Not on file  . Number of children: 3  . Years of education: Not on file  . Highest education level: Not on file  Occupational History  . Occupation: Arts administrator  Tobacco Use  . Smoking status: Never Smoker  . Smokeless tobacco: Never Used  Substance and Sexual Activity  . Alcohol use: Yes    Comment: 3-4 glasses wine / week  . Drug use: Never  . Sexual activity: Not on file  Other Topics Concern  . Not on file  Social History Narrative  . Not on file   Social Determinants of Health   Financial Resource Strain: Not on file  Food Insecurity: Not on file  Transportation Needs: Not on file  Physical Activity: Not on file  Stress: Not on file  Social Connections: Not on file     Family History: The patient's family history includes Heart disease in his father and mother.  ROS:   Please see the history of present illness.    All other systems  reviewed and are negative.  EKGs/Labs/Other Studies Reviewed:    The following studies were reviewed today:  May 26, 2020 echo personally reviewed Left ventricular function normal, 60% Mild LVH Right ventricular function normal No significant valvular abnormalities   05/27/2020 ECG  2-1 atrial flutter.  Right bundle-branch block, left anterior fascicular block.   EKG:  The ekg ordered today demonstrates typical appearing atrial flutter with 2-1 AV conduction.  Right bundle branch block and a left anterior fascicular block.  Recent Labs: 05/12/2020: ALT 20; BUN 19;  Creatinine, Ser 0.96; Hemoglobin 16.2; Platelets 267; Potassium 5.1; Sodium 140; TSH 3.970  Recent Lipid Panel No results found for: CHOL, TRIG, HDL, CHOLHDL, VLDL, LDLCALC, LDLDIRECT  Physical Exam:    VS:  BP 114/74   Pulse (!) 147   Ht 5\' 10"  (1.778 m)   Wt 193 lb (87.5 kg)   SpO2 97%   BMI 27.69 kg/m     Wt Readings from Last 3 Encounters:  06/02/20 193 lb (87.5 kg)  05/27/20 192 lb 9.6 oz (87.4 kg)  05/14/20 190 lb 9.6 oz (86.5 kg)     GEN:  Well nourished, well developed in no acute distress.  Appears younger than stated age 89: Normal NECK: No JVD; No carotid bruits LYMPHATICS: No lymphadenopathy CARDIAC: Regular rhythm, tachycardic., no murmurs, rubs, gallops RESPIRATORY:  Clear to auscultation without rales, wheezing or rhonchi  ABDOMEN: Soft, non-tender, non-distended MUSCULOSKELETAL: Trace bilateral lower extremity pitting edema; No deformity  SKIN: Warm and dry NEUROLOGIC:  Alert and oriented x 3 PSYCHIATRIC:  Normal affect   ASSESSMENT:    1. Typical atrial flutter (HCC)    PLAN:    In order of problems listed above:  1. Typical atrial flutter Patient has relatively asymptomatic typical atrial flutter with a right bundle branch block and left anterior fascicular block.  Unfortunately he has had very difficult to control heart rates and is locked into a heart rate of 150 bpm.  Although he is relatively asymptomatic, he does tell me that at times he will have brief periods of lightheadedness.  I discussed the available treatment options including antiarrhythmic therapy and ablation with the patient.  We discussed the efficacy of both options.  We specifically discussed the possibility of atrial fibrillation developing after an atrial flutter ablation.  We would plan on performing an atrial flutter ablation now and after 3 months, implanting a loop recorder for ongoing surveillance for atrial fibrillation. If no recurrence of atrial flutter in the 3 months  following the ablation, could stop the anticoagulant.   He would like to proceed with scheduling a typical flutter ablation.  Risk, benefits, and alternatives to EP study and radiofrequency ablation for atrial flutter were also discussed in detail today. These risks include but are not limited to stroke, bleeding, vascular damage, tamponade, perforation, damage to the esophagus, lungs, and other structures, pulmonary vein stenosis, worsening renal function, and death. The patient understands these risk and wishes to proceed.  We will therefore proceed with catheter ablation at the next available time.  Carto, ICE, anesthesia are requested for the procedure.    He will continue taking Eliquis.   Medication Adjustments/Labs and Tests Ordered: Current medicines are reviewed at length with the patient today.  Concerns regarding medicines are outlined above.  Orders Placed This Encounter  Procedures  . EKG 12-Lead   No orders of the defined types were placed in this encounter.    Signed, Lars Mage, MD, [facility]  06/02/2020 10:54 AM  Electrophysiology [facility]

## 2020-06-04 DIAGNOSIS — R002 Palpitations: Secondary | ICD-10-CM | POA: Diagnosis not present

## 2020-06-16 ENCOUNTER — Telehealth: Payer: Self-pay

## 2020-06-16 DIAGNOSIS — I4891 Unspecified atrial fibrillation: Secondary | ICD-10-CM

## 2020-06-23 ENCOUNTER — Encounter (HOSPITAL_COMMUNITY): Payer: Self-pay

## 2020-06-24 NOTE — Telephone Encounter (Signed)
Pt has been rescheduled for afib/flutter ablation on August 10, 2020 at 7:30 am  Will plan to get lab work at office visit with Dr. Marisue Ivan on July 31, 2020  Covid test rescheduled.  Added order for cardiac CT.  Instruction letter for CT and updated instruction letter for ablation sent via mychart.  Staff message sent to precert and scheduling  Work up complete.

## 2020-06-29 DIAGNOSIS — H2513 Age-related nuclear cataract, bilateral: Secondary | ICD-10-CM | POA: Diagnosis not present

## 2020-06-29 DIAGNOSIS — H524 Presbyopia: Secondary | ICD-10-CM | POA: Diagnosis not present

## 2020-06-29 DIAGNOSIS — H43813 Vitreous degeneration, bilateral: Secondary | ICD-10-CM | POA: Diagnosis not present

## 2020-07-10 ENCOUNTER — Other Ambulatory Visit: Payer: Medicare Other

## 2020-07-10 ENCOUNTER — Other Ambulatory Visit (HOSPITAL_COMMUNITY): Payer: Medicare Other

## 2020-07-22 DIAGNOSIS — E781 Pure hyperglyceridemia: Secondary | ICD-10-CM | POA: Diagnosis not present

## 2020-07-22 DIAGNOSIS — I10 Essential (primary) hypertension: Secondary | ICD-10-CM | POA: Diagnosis not present

## 2020-07-22 DIAGNOSIS — I480 Paroxysmal atrial fibrillation: Secondary | ICD-10-CM | POA: Diagnosis not present

## 2020-07-22 DIAGNOSIS — E1140 Type 2 diabetes mellitus with diabetic neuropathy, unspecified: Secondary | ICD-10-CM | POA: Diagnosis not present

## 2020-07-22 DIAGNOSIS — K219 Gastro-esophageal reflux disease without esophagitis: Secondary | ICD-10-CM | POA: Diagnosis not present

## 2020-07-30 NOTE — Progress Notes (Signed)
Cardiology Office Note:   Date:  07/31/2020  NAME:  Wesley Matthews    MRN: [id number] DOB:  30-Sep-1945   PCP:  Kathyrn Lass, MD  Cardiologist:  No primary care provider on file.  Electrophysiologist:  Vickie Epley, MD   Referring MD: Kathyrn Lass, MD   Chief Complaint  Patient presents with  . Follow-up   History of Present Illness:   Wesley Matthews is a 75 y.o. male with a hx of AFL/AFib, HTN, DM who presents for follow-up. Awaiting AFL/AFib ablation later this month.  He reports he has had roughly 2 A. fib episodes in the past month.  He is in a regular heart rhythm today.  He denies any chest pain or shortness of breath.  He is playing golf without any limitations.  All of his cardiovascular disease risk factors are well modified.  We will get a calcium score with his CT scan.  He seems to be doing well.  Blood pressure well controlled 130/65.  He should not be taken digoxin.  We will remove this from his list.  He is working on his diabetes.  He has plans for an atrial fibrillation and atrial flutter ablation with Dr. Quentin Ore.  Overall doing remarkably well.  He is a little bit anxious about his upcoming procedure which is understandable.  Problem List 1. Diabetes -A1c 6.8 2. Hypertension 3. HLD -Total cholesterol 110, HDL 55, LDL 40, triglycerides 70. 4. Atrial Flutter -typical 05/12/2020 -CHADSVASC= 3 (age, DM, HTN) -TEE/DCCV 05/14/2020 -Recurrence 05/25/2020  Past Medical History: Past Medical History:  Diagnosis Date  . BPH (benign prostatic hyperplasia)   . Diabetes mellitus without complication ([place])   . ED (erectile dysfunction)   . GERD (gastroesophageal reflux disease)   . Hyperlipidemia   . Hypertension     Past Surgical History: Past Surgical History:  Procedure Laterality Date  . 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 CARDIOVERSION N/A 05/14/2020   Procedure: CARDIOVERSION;  Surgeon: Lelon Perla, MD;  Location: [facility] ENDOSCOPY;  Service:  Cardiovascular;  Laterality: N/A;  . TEE WITHOUT CARDIOVERSION N/A 05/14/2020   Procedure: TRANSESOPHAGEAL ECHOCARDIOGRAM (TEE);  Surgeon: Lelon Perla, MD;  Location: [facility] ENDOSCOPY;  Service: Cardiovascular;  Laterality: N/A;    Current Medications: Current Meds  Medication Sig  . apixaban (ELIQUIS) 5 MG TABS tablet Take 1 tablet (5 mg total) by mouth 2 (two) times daily.  Marland Kitchen aspirin-sod bicarb-citric acid (ALKA-SELTZER) 325 MG TBEF tablet Take 325 mg by mouth at bedtime.  Marland Kitchen atorvastatin (LIPITOR) 10 MG tablet Take 10 mg by mouth at bedtime.  . fluticasone (FLONASE) 50 MCG/ACT nasal spray Place 1 spray into both nostrils 2 (two) times daily.  Marland Kitchen GLUCOSAMINE-CHONDROITIN PO Take 2 tablets by mouth daily.  Marland Kitchen ibuprofen (ADVIL) 200 MG tablet Take 200 mg by mouth every 6 (six) hours as needed for mild pain or moderate pain.  Marland Kitchen JARDIANCE 10 MG TABS tablet Take 10 mg by mouth daily.  . Lancets (ONETOUCH ULTRASOFT) lancets   . metFORMIN (GLUCOPHAGE) 500 MG tablet Take 1,000 mg by mouth 2 (two) times daily.  . metoprolol tartrate (LOPRESSOR) 25 MG tablet Take 1 tablet (25 mg total) by mouth 2 (two) times daily.  Marland Kitchen omeprazole (PRILOSEC) 20 MG capsule Take 20 mg by mouth daily with supper.  . phenylephrine (SUDAFED PE) 10 MG TABS tablet Take 10 mg by mouth at bedtime as needed (allergies).  . sildenafil (VIAGRA) 100 MG tablet Take 50 mg by  mouth as needed for erectile dysfunction.  . [DISCONTINUED] digoxin (LANOXIN) 0.125 MG tablet Take 1 tablet (0.125 mg total) by mouth daily.     Allergies:    Catering manager   Social History: Social History   Socioeconomic History  . Marital status: Married    Spouse name: Not on file  . Number of children: 3  . Years of education: Not on file  . Highest education level: Not on file  Occupational History  . Occupation: Arts administrator  Tobacco Use  . Smoking status: Never Smoker  . Smokeless tobacco: Never Used  Substance and Sexual Activity  .  Alcohol use: Yes    Comment: 3-4 glasses wine / week  . Drug use: Never  . Sexual activity: Not on file  Other Topics Concern  . Not on file  Social History Narrative  . Not on file   Social Determinants of Health   Financial Resource Strain: Not on file  Food Insecurity: Not on file  Transportation Needs: Not on file  Physical Activity: Not on file  Stress: Not on file  Social Connections: Not on file     Family History: The patient's family history includes Heart disease in his father and mother.  ROS:   All other ROS reviewed and negative. Pertinent positives noted in the HPI.     EKGs/Labs/Other Studies Reviewed:   The following studies were personally reviewed by me today:   [place] 06/07/2020 Impression: 1. Episodes of SVT appear to possibly represent atrial fibrillation.  2. Atrial flutter detected.  3. 20% AF/AFL burden.  4. Rare ectopy.   Echo 05/27/2020 1. Left ventricular ejection fraction, by estimation, is 60 to 65%. The  left ventricle has normal function. The left ventricle has no regional  wall motion abnormalities. There is mild left ventricular hypertrophy.  Left ventricular diastolic parameters  are consistent with Grade I diastolic dysfunction (impaired relaxation).  2. Right ventricular systolic function is normal. The right ventricular  size is normal.  3. The mitral valve is grossly normal. Trivial mitral valve  regurgitation.  4. The aortic valve is tricuspid. Aortic valve regurgitation is not  visualized. Mild aortic valve sclerosis is present, with no evidence of  aortic valve stenosis.  5. The inferior vena cava is normal in size with greater than 50%  respiratory variability, suggesting right atrial pressure of 3 mmHg.    Recent Labs: 05/12/2020: ALT 20; BUN 19; Creatinine, Ser 0.96; Hemoglobin 16.2; Platelets 267; Potassium 5.1; Sodium 140; TSH 3.970   Recent Lipid Panel No results found for: CHOL, TRIG, HDL, CHOLHDL, VLDL, LDLCALC,  LDLDIRECT  Physical Exam:   VS:  BP 132/65   Pulse 74   Ht 5\' 10"  (1.778 m)   Wt 188 lb (85.3 kg)   SpO2 98%   BMI 26.98 kg/m    Wt Readings from Last 3 Encounters:  07/31/20 188 lb (85.3 kg)  06/02/20 193 lb (87.5 kg)  05/27/20 192 lb 9.6 oz (87.4 kg)    General: Well nourished, well developed, in no acute distress Head: Atraumatic, normal size  Eyes: PEERLA, EOMI  Neck: Supple, no JVD Endocrine: No thryomegaly Cardiac: Normal S1, S2; RRR; no murmurs, rubs, or gallops Lungs: Clear to auscultation bilaterally, no wheezing, rhonchi or rales  Abd: Soft, nontender, no hepatomegaly  Ext: No edema, pulses 2+ Musculoskeletal: No deformities, BUE and BLE strength normal and equal Skin: Warm and dry, no rashes   Neuro: Alert and oriented to person, place, time, and  situation, CNII-XII grossly intact, no focal deficits  Psych: Normal mood and affect   ASSESSMENT:   Wesley Matthews is a 75 y.o. male who presents for the following: 1. Atrial fibrillation, unspecified type ([place])   2. Typical atrial flutter (HCC)   3. Palpitations     PLAN:   1. Atrial fibrillation, unspecified type ([place]) 2. Typical atrial flutter (HCC) 3. Palpitations -typical 05/12/2020 -CHADSVASC= 3 (age, DM, HTN) -TEE/DCCV 05/14/2020 -Recurrence 05/25/2020 -He will undergo atrial fibrillation and atrial flutter ablation on 08/10/2020.  He will continue his Eliquis.  He will obtain a cardiac CT prior to the study.  Heart rate is 74.  I have instructed him to take 100 mg of metoprolol tartrate charge before the scan.  We will obtain a BMP today as well.  I have also asked scheduling to give him nitroglycerin before his cardiac CT.  I would like to get a look at his coronaries as well. -Echocardiogram shows normal cardiac function.  Normal thyroid studies.  I suspect his A. fib is related to his risk factors of diabetes and high blood pressure.  All of his risk factors are well controlled.  His LDL cholesterol is  well controlled 2. -We will continue Eliquis 5 mg twice daily.  No bleeding issues.  He understands it is very important to not miss any doses. -I will defer management to electrophysiology for the next several months.  He will see me back in 6 months and close follow-up.  Disposition: Return in about 6 months (around 01/30/2021).  Medication Adjustments/Labs and Tests Ordered: Current medicines are reviewed at length with the patient today.  Concerns regarding medicines are outlined above.  Orders Placed This Encounter  Procedures  . Basic metabolic panel   No orders of the defined types were placed in this encounter.   Patient Instructions  Medication Instructions:  Take 4 tablets of Metoprolol (100 mg) 2 hours before your CT   *If you need a refill on your cardiac medications before your next appointment, please call your pharmacy*   Lab Work: BMET today  If you have labs (blood work) drawn today and your tests are completely normal, you will receive your results only by: Marland Kitchen MyChart Message (if you have MyChart) OR . A paper copy in the mail If you have any lab test that is abnormal or we need to change your treatment, we will call you to review the results.   Follow-Up: At [facility], you and your health needs are our priority.  As part of our continuing mission to provide you with exceptional heart care, we have created designated Provider Care Teams.  These Care Teams include your primary Cardiologist (physician) and Advanced Practice Providers (APPs -  Physician Assistants and Nurse Practitioners) who all work together to provide you with the care you need, when you need it.  We recommend signing up for the patient portal called "MyChart".  Sign up information is provided on this After Visit Summary.  MyChart is used to connect with patients for Virtual Visits (Telemedicine).  Patients are able to view lab/test results, encounter notes, upcoming appointments, etc.   Non-urgent messages can be sent to your provider as well.   To learn more about what you can do with MyChart, go to [email or web address].    Your next appointment:   6 month(s)  The format for your next appointment:   In Person  Provider:   Eleonore Chiquito, MD  Time Spent with Patient: I have spent a total of 35 minutes with patient reviewing hospital notes, telemetry, EKGs, labs and examining the patient as well as establishing an assessment and plan that was discussed with the patient.  > 50% of time was spent in direct patient care.  Signed, Addison Naegeli. Audie Box, MD, [place]  [address], [place], [place] [address] [phone]  07/31/2020 4:41 PM

## 2020-07-31 ENCOUNTER — Ambulatory Visit: Payer: Medicare Other | Admitting: Cardiovascular Disease

## 2020-07-31 ENCOUNTER — Other Ambulatory Visit: Payer: Self-pay

## 2020-07-31 ENCOUNTER — Encounter: Payer: Self-pay | Admitting: Cardiovascular Disease

## 2020-07-31 ENCOUNTER — Telehealth (HOSPITAL_COMMUNITY): Payer: Self-pay | Admitting: *Deleted

## 2020-07-31 VITALS — BP 132/65 | HR 74 | Ht 70.0 in | Wt 188.0 lb

## 2020-07-31 DIAGNOSIS — I4891 Unspecified atrial fibrillation: Secondary | ICD-10-CM

## 2020-07-31 DIAGNOSIS — R002 Palpitations: Secondary | ICD-10-CM | POA: Diagnosis not present

## 2020-07-31 DIAGNOSIS — I483 Typical atrial flutter: Secondary | ICD-10-CM

## 2020-07-31 NOTE — Patient Instructions (Addendum)
Medication Instructions:  Take 4 tablets of Metoprolol (100 mg) 2 hours before your CT   *If you need a refill on your cardiac medications before your next appointment, please call your pharmacy*   Lab Work: BMET today  If you have labs (blood work) drawn today and your tests are completely normal, you will receive your results only by: Marland Kitchen MyChart Message (if you have MyChart) OR . A paper copy in the mail If you have any lab test that is abnormal or we need to change your treatment, we will call you to review the results.   Follow-Up: At [facility] you and your health needs are our priority.  As part of our continuing mission to provide you with exceptional heart care, we have created designated Provider Care Teams.  These Care Teams include your primary Cardiologist (physician) and Advanced Practice Providers (APPs -  Physician Assistants and Nurse Practitioners) who all work together to provide you with the care you need, when you need it.  We recommend signing up for the patient portal called "MyChart".  Sign up information is provided on this After Visit Summary.  MyChart is used to connect with patients for Virtual Visits (Telemedicine).  Patients are able to view lab/test results, encounter notes, upcoming appointments, etc.  Non-urgent messages can be sent to your provider as well.   To learn more about what you can do with MyChart, go to [email or web address].    Your next appointment:   6 month(s)  The format for your next appointment:   In Person  Provider:   Eleonore Chiquito, MD

## 2020-07-31 NOTE — Telephone Encounter (Signed)
Reaching out to patient to offer assistance regarding upcoming cardiac imaging study; pt verbalizes understanding of appt date/time, parking situation and where to check in, pre-test NPO status and medications ordered, and verified current allergies; name and call back number provided for further questions should they arise  Wesley Clement RN Navigator Cardiac [place] [facility] [phone] office [phone] cell  Pt to take his normal metoprolol dose unless HR is higher than 65bpm.

## 2020-08-01 LAB — BASIC METABOLIC PANEL
BUN/Creatinine Ratio: 17 (ref 10–24)
BUN: 18 mg/dL (ref 8–27)
CO2: 22 mmol/L (ref 20–29)
Calcium: 9.9 mg/dL (ref 8.6–10.2)
Chloride: 101 mmol/L (ref 96–106)
Creatinine, Ser: 1.07 mg/dL (ref 0.76–1.27)
Glucose: 156 mg/dL — ABNORMAL HIGH (ref 65–99)
Potassium: 4.3 mmol/L (ref 3.5–5.2)
Sodium: 142 mmol/L (ref 134–144)
eGFR: 73 mL/min/{1.73_m2} (ref >59)

## 2020-08-03 ENCOUNTER — Telehealth: Payer: Self-pay | Admitting: Cardiology

## 2020-08-03 ENCOUNTER — Ambulatory Visit (HOSPITAL_COMMUNITY)
Admission: RE | Admit: 2020-08-03 | Discharge: 2020-08-03 | Disposition: A | Discharge status: Home / Self Care | Payer: Medicare Other | Source: Ambulatory Visit | Attending: Cardiology | Admitting: Cardiology

## 2020-08-03 ENCOUNTER — Other Ambulatory Visit: Payer: Self-pay

## 2020-08-03 ENCOUNTER — Ambulatory Visit (HOSPITAL_COMMUNITY): Payer: Medicare Other

## 2020-08-03 DIAGNOSIS — N2889 Other specified disorders of kidney and ureter: Secondary | ICD-10-CM

## 2020-08-03 DIAGNOSIS — I4891 Unspecified atrial fibrillation: Secondary | ICD-10-CM | POA: Diagnosis not present

## 2020-08-03 MED ORDER — IOHEXOL 350 MG/ML SOLN
80.0000 mL | Freq: Once | INTRAVENOUS | Status: AC | PRN
  Administered 2020-08-03: 80 mL via INTRAVENOUS

## 2020-08-03 NOTE — Telephone Encounter (Signed)
1. 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 Further evaluation with nonemergent abdominal MRI with and without IV gadolinium is strongly recommended in the near future to evaluate for potential cystic renal neoplasm.

## 2020-08-03 NOTE — Telephone Encounter (Signed)
Per Dr. Quentin Ore:  1.  Cancel ablation 2.  Order MRI 3.  Send copy of CT results to PCP-Pt will need follow up with PCP  4.  Schedule 3 mo f/u to rediscuss ablation    Ablation cancelled. MRI ordered. Sent CT results to PCP. 3 month follow up scheduled.    Will send message to Pt's PCP to advise.

## 2020-08-03 NOTE — Telephone Encounter (Signed)
Called to speak with patient re: the CT results. We will need to cancel the ablation procedure until the mass is further evaluated. He has already messaged his primary care physician at [facility] and is awaiting a return call. I have asked him to reach back out to [place] if he has not heard back by Wednesday of this week. We will order the requested MRI to further evaluate the mass.  I have asked the patient to continue his current medical therapy for his atrial flutter. We will plan to see him in the office in 3 months to regroup.  Lysbeth Galas T. Quentin Ore, MD, [facility], [facility] Cardiac Electrophysiology

## 2020-08-03 NOTE — Telephone Encounter (Signed)
Wesley Matthews from [facility] Radiology have Critical results for pt

## 2020-08-04 ENCOUNTER — Other Ambulatory Visit: Payer: Self-pay | Admitting: Family Medicine

## 2020-08-04 ENCOUNTER — Other Ambulatory Visit (HOSPITAL_COMMUNITY): Payer: Self-pay | Admitting: Family Medicine

## 2020-08-04 DIAGNOSIS — N2889 Other specified disorders of kidney and ureter: Secondary | ICD-10-CM

## 2020-08-07 ENCOUNTER — Other Ambulatory Visit (HOSPITAL_COMMUNITY): Payer: Medicare Other

## 2020-08-07 ENCOUNTER — Other Ambulatory Visit: Payer: Self-pay

## 2020-08-07 ENCOUNTER — Ambulatory Visit (HOSPITAL_COMMUNITY)
Admission: RE | Admit: 2020-08-07 | Discharge: 2020-08-07 | Disposition: A | Discharge status: Home / Self Care | Payer: Medicare Other | Source: Ambulatory Visit | Attending: Family Medicine | Admitting: Family Medicine

## 2020-08-07 DIAGNOSIS — N281 Cyst of kidney, acquired: Secondary | ICD-10-CM | POA: Diagnosis not present

## 2020-08-07 DIAGNOSIS — N2889 Other specified disorders of kidney and ureter: Secondary | ICD-10-CM | POA: Insufficient documentation

## 2020-08-07 DIAGNOSIS — K8020 Calculus of gallbladder without cholecystitis without obstruction: Secondary | ICD-10-CM | POA: Diagnosis not present

## 2020-08-07 MED ORDER — GADOBUTROL 1 MMOL/ML IV SOLN
8.5000 mL | Freq: Once | INTRAVENOUS | Status: AC | PRN
  Administered 2020-08-07: 8.5 mL via INTRAVENOUS

## 2020-08-10 ENCOUNTER — Ambulatory Visit (HOSPITAL_COMMUNITY): Admit: 2020-08-10 | Payer: Medicare Other | Admitting: Cardiology

## 2020-08-10 ENCOUNTER — Encounter (HOSPITAL_COMMUNITY): Payer: Medicare Other

## 2020-08-10 SURGERY — ATRIAL FIBRILLATION ABLATION
Anesthesia: General

## 2020-08-11 ENCOUNTER — Ambulatory Visit (HOSPITAL_COMMUNITY): Payer: Medicare Other | Admitting: Nurse Practitioner

## 2020-08-12 NOTE — Telephone Encounter (Signed)
Per Dr. Sena Hitch should see PCP before rescheduling ablation.  Pt advised per mychart.

## 2020-08-13 ENCOUNTER — Telehealth: Payer: Self-pay

## 2020-08-13 NOTE — Telephone Encounter (Signed)
Pt has follow up with PCP 08/19/20.  Per Dr. Tillman Sers f/u with PCP prior to rescheduling ablation.  Tentatively holding May 31 at 10:30 am for Pt .  Continue to follow

## 2020-08-14 DIAGNOSIS — Z01818 Encounter for other preprocedural examination: Secondary | ICD-10-CM | POA: Diagnosis not present

## 2020-08-19 DIAGNOSIS — E1169 Type 2 diabetes mellitus with other specified complication: Secondary | ICD-10-CM | POA: Diagnosis not present

## 2020-08-19 DIAGNOSIS — E781 Pure hyperglyceridemia: Secondary | ICD-10-CM | POA: Diagnosis not present

## 2020-08-19 DIAGNOSIS — J309 Allergic rhinitis, unspecified: Secondary | ICD-10-CM | POA: Diagnosis not present

## 2020-08-19 DIAGNOSIS — I129 Hypertensive chronic kidney disease with stage 1 through stage 4 chronic kidney disease, or unspecified chronic kidney disease: Secondary | ICD-10-CM | POA: Diagnosis not present

## 2020-08-19 DIAGNOSIS — I480 Paroxysmal atrial fibrillation: Secondary | ICD-10-CM | POA: Diagnosis not present

## 2020-08-19 DIAGNOSIS — I10 Essential (primary) hypertension: Secondary | ICD-10-CM | POA: Diagnosis not present

## 2020-08-19 DIAGNOSIS — I4892 Unspecified atrial flutter: Secondary | ICD-10-CM | POA: Diagnosis not present

## 2020-08-24 NOTE — Telephone Encounter (Signed)
Pt rescheduled for ablation Sep 15, 2020  covid test scheduled  Work up complete

## 2020-08-28 DIAGNOSIS — Z85828 Personal history of other malignant neoplasm of skin: Secondary | ICD-10-CM | POA: Diagnosis not present

## 2020-08-28 DIAGNOSIS — C4441 Basal cell carcinoma of skin of scalp and neck: Secondary | ICD-10-CM | POA: Diagnosis not present

## 2020-08-28 DIAGNOSIS — L565 Disseminated superficial actinic porokeratosis (DSAP): Secondary | ICD-10-CM | POA: Diagnosis not present

## 2020-08-28 DIAGNOSIS — D485 Neoplasm of uncertain behavior of skin: Secondary | ICD-10-CM | POA: Diagnosis not present

## 2020-08-28 DIAGNOSIS — L821 Other seborrheic keratosis: Secondary | ICD-10-CM | POA: Diagnosis not present

## 2020-08-28 DIAGNOSIS — H61001 Unspecified perichondritis of right external ear: Secondary | ICD-10-CM | POA: Diagnosis not present

## 2020-08-28 DIAGNOSIS — L570 Actinic keratosis: Secondary | ICD-10-CM | POA: Diagnosis not present

## 2020-09-07 ENCOUNTER — Ambulatory Visit (HOSPITAL_COMMUNITY): Payer: Medicare Other | Admitting: Nurse Practitioner

## 2020-09-07 ENCOUNTER — Ambulatory Visit (HOSPITAL_COMMUNITY): Payer: Medicare Other | Admitting: Physician Assistant

## 2020-09-11 ENCOUNTER — Other Ambulatory Visit (HOSPITAL_COMMUNITY): Payer: Medicare Other

## 2020-09-11 NOTE — Pre-Procedure Instructions (Signed)
Instructed patient on the following items: Arrival time 0830 Nothing to eat or drink after midnight No meds AM of procedure Responsible person to drive you home and stay with you for 24 hrs  Have you missed any doses of anti-coagulant Eliquis- hasn't missed any doses   

## 2020-09-14 NOTE — Anesthesia Preprocedure Evaluation (Addendum)
Anesthesia Evaluation  Patient identified by MRN, date of birth, ID band Patient awake    Reviewed: Allergy & Precautions, NPO status , Patient's Chart, lab work & pertinent test results  History of Anesthesia Complications Negative for: history of anesthetic complications  Airway Mallampati: II  TM Distance: >3 FB Neck ROM: Full    Dental no notable dental hx. (+) Teeth Intact, Dental Advisory Given   Pulmonary neg pulmonary ROS,    Pulmonary exam normal        Cardiovascular hypertension, Normal cardiovascular exam+ dysrhythmias (on Eliquis) Atrial Fibrillation   TTE 05/2020: EF 60-65%, mild LVH, grade I DD, valves ok   Neuro/Psych negative neurological ROS  negative psych ROS   GI/Hepatic Neg liver ROS, GERD  Medicated and Controlled,  Endo/Other  diabetes, Type 2, Oral Hypoglycemic Agents  Renal/GU negative Renal ROS  negative genitourinary   Musculoskeletal negative musculoskeletal ROS (+)   Abdominal   Peds  Hematology negative hematology ROS (+)   Anesthesia Other Findings Day of surgery medications reviewed with patient.  Reproductive/Obstetrics negative OB ROS                          Anesthesia Physical Anesthesia Plan  ASA: III  Anesthesia Plan: General   Post-op Pain Management:    Induction: Intravenous  PONV Risk Score and Plan: 2 and Treatment may vary due to age or medical condition, Ondansetron and Dexamethasone  Airway Management Planned: Oral ETT  Additional Equipment: None  Intra-op Plan:   Post-operative Plan: Extubation in OR  Informed Consent: I have reviewed the patients History and Physical, chart, labs and discussed the procedure including the risks, benefits and alternatives for the proposed anesthesia with the patient or authorized representative who has indicated his/her understanding and acceptance.     Dental advisory given  Plan Discussed  with: CRNA  Anesthesia Plan Comments:       Anesthesia Quick Evaluation

## 2020-09-15 ENCOUNTER — Encounter (HOSPITAL_COMMUNITY): Payer: Self-pay | Admitting: Cardiology

## 2020-09-15 ENCOUNTER — Ambulatory Visit (HOSPITAL_COMMUNITY)
Admission: RE | Disposition: A | Discharge status: Home / Self Care | Payer: Medicare Other | Source: Home / Self Care | Attending: Cardiology

## 2020-09-15 ENCOUNTER — Ambulatory Visit (HOSPITAL_COMMUNITY): Payer: Medicare Other | Admitting: Anesthesiology

## 2020-09-15 ENCOUNTER — Ambulatory Visit (HOSPITAL_COMMUNITY)
Admission: RE | Admit: 2020-09-15 | Discharge: 2020-09-15 | Disposition: A | Discharge status: Home / Self Care | Payer: Medicare Other | Attending: Cardiology | Admitting: Cardiology

## 2020-09-15 ENCOUNTER — Other Ambulatory Visit: Payer: Self-pay

## 2020-09-15 DIAGNOSIS — Z7984 Long term (current) use of oral hypoglycemic drugs: Secondary | ICD-10-CM | POA: Diagnosis not present

## 2020-09-15 DIAGNOSIS — N400 Enlarged prostate without lower urinary tract symptoms: Secondary | ICD-10-CM | POA: Insufficient documentation

## 2020-09-15 DIAGNOSIS — I452 Bifascicular block: Secondary | ICD-10-CM | POA: Insufficient documentation

## 2020-09-15 DIAGNOSIS — Z85828 Personal history of other malignant neoplasm of skin: Secondary | ICD-10-CM | POA: Insufficient documentation

## 2020-09-15 DIAGNOSIS — I4891 Unspecified atrial fibrillation: Secondary | ICD-10-CM | POA: Diagnosis not present

## 2020-09-15 DIAGNOSIS — Z8249 Family history of ischemic heart disease and other diseases of the circulatory system: Secondary | ICD-10-CM | POA: Insufficient documentation

## 2020-09-15 DIAGNOSIS — I480 Paroxysmal atrial fibrillation: Secondary | ICD-10-CM | POA: Diagnosis not present

## 2020-09-15 DIAGNOSIS — E785 Hyperlipidemia, unspecified: Secondary | ICD-10-CM | POA: Diagnosis not present

## 2020-09-15 DIAGNOSIS — I119 Hypertensive heart disease without heart failure: Secondary | ICD-10-CM | POA: Diagnosis not present

## 2020-09-15 DIAGNOSIS — I4892 Unspecified atrial flutter: Secondary | ICD-10-CM | POA: Diagnosis not present

## 2020-09-15 DIAGNOSIS — I483 Typical atrial flutter: Secondary | ICD-10-CM | POA: Insufficient documentation

## 2020-09-15 DIAGNOSIS — E119 Type 2 diabetes mellitus without complications: Secondary | ICD-10-CM | POA: Diagnosis not present

## 2020-09-15 HISTORY — PX: ATRIAL FIBRILLATION ABLATION: EP1191

## 2020-09-15 LAB — CBC
HCT: 46.7 % (ref 39.0–52.0)
Hemoglobin: 15.2 g/dL (ref 13.0–17.0)
MCH: 30.3 pg (ref 26.0–34.0)
MCHC: 32.5 g/dL (ref 30.0–36.0)
MCV: 93 fL (ref 80.0–100.0)
Platelets: 195 10*3/uL (ref 150–400)
RBC: 5.02 MIL/uL (ref 4.22–5.81)
RDW: 14.3 % (ref 11.5–15.5)
WBC: 4.6 10*3/uL (ref 4.0–10.5)
nRBC: 0 % (ref 0.0–0.2)

## 2020-09-15 LAB — GLUCOSE, CAPILLARY
Glucose-Capillary: 123 mg/dL — ABNORMAL HIGH (ref 70–99)
Glucose-Capillary: 131 mg/dL — ABNORMAL HIGH (ref 70–99)

## 2020-09-15 LAB — BASIC METABOLIC PANEL
Anion gap: 8 (ref 5–15)
BUN: 19 mg/dL (ref 8–23)
CO2: 27 mmol/L (ref 22–32)
Calcium: 9 mg/dL (ref 8.9–10.3)
Chloride: 104 mmol/L (ref 98–111)
Creatinine, Ser: 0.91 mg/dL (ref 0.61–1.24)
GFR, Estimated: >60 mL/min (ref >60)
Glucose, Bld: 132 mg/dL — ABNORMAL HIGH (ref 70–99)
Potassium: 4.4 mmol/L (ref 3.5–5.1)
Sodium: 139 mmol/L (ref 135–145)

## 2020-09-15 LAB — POCT ACTIVATED CLOTTING TIME
Activated Clotting Time: 255 seconds
Activated Clotting Time: 279 seconds
Activated Clotting Time: 369 seconds

## 2020-09-15 SURGERY — ATRIAL FIBRILLATION ABLATION
Anesthesia: General

## 2020-09-15 MED ORDER — DEXAMETHASONE SODIUM PHOSPHATE 4 MG/ML IJ SOLN
INTRAMUSCULAR | Status: DC | PRN
  Administered 2020-09-15: 4 mg via INTRAVENOUS

## 2020-09-15 MED ORDER — ROCURONIUM BROMIDE 10 MG/ML (PF) SYRINGE
PREFILLED_SYRINGE | INTRAVENOUS | Status: DC | PRN
  Administered 2020-09-15: 100 mg via INTRAVENOUS
  Administered 2020-09-15: 10 mg via INTRAVENOUS
  Administered 2020-09-15: 20 mg via INTRAVENOUS

## 2020-09-15 MED ORDER — SODIUM CHLORIDE 0.9 % IV SOLN: 250.0000 mL | INTRAVENOUS | Status: DC | PRN

## 2020-09-15 MED ORDER — HEPARIN (PORCINE) IN NACL 1000-0.9 UT/500ML-% IV SOLN
INTRAVENOUS | Status: AC
  Filled 2020-09-15: qty 2000

## 2020-09-15 MED ORDER — ISOPROTERENOL HCL 0.2 MG/ML IJ SOLN
INTRAMUSCULAR | Status: AC
  Filled 2020-09-15: qty 5

## 2020-09-15 MED ORDER — PHENYLEPHRINE 40 MCG/ML (10ML) SYRINGE FOR IV PUSH (FOR BLOOD PRESSURE SUPPORT)
PREFILLED_SYRINGE | INTRAVENOUS | Status: DC | PRN
  Administered 2020-09-15 (×3): 80 ug via INTRAVENOUS
  Administered 2020-09-15: 160 ug via INTRAVENOUS

## 2020-09-15 MED ORDER — MIDAZOLAM HCL 2 MG/2ML IJ SOLN
INTRAMUSCULAR | Status: DC | PRN
  Administered 2020-09-15 (×2): 1 mg via INTRAVENOUS

## 2020-09-15 MED ORDER — ACETAMINOPHEN 325 MG PO TABS
650.0000 mg | ORAL_TABLET | ORAL | Status: DC | PRN
  Filled 2020-09-15: qty 2

## 2020-09-15 MED ORDER — ISOPROTERENOL HCL 0.2 MG/ML IJ SOLN
INTRAVENOUS | Status: DC | PRN
  Administered 2020-09-15: 2 ug/min via INTRAVENOUS

## 2020-09-15 MED ORDER — HEPARIN (PORCINE) IN NACL 2-0.9 UNITS/ML
INTRAMUSCULAR | Status: AC | PRN
  Administered 2020-09-15: 500 mL

## 2020-09-15 MED ORDER — APIXABAN 5 MG PO TABS
5.0000 mg | ORAL_TABLET | Freq: Two times a day (BID) | ORAL | Status: DC
  Administered 2020-09-15: 5 mg via ORAL
  Filled 2020-09-15: qty 1

## 2020-09-15 MED ORDER — ONDANSETRON HCL 4 MG/2ML IJ SOLN: 4.0000 mg | Freq: Four times a day (QID) | INTRAMUSCULAR | Status: DC | PRN

## 2020-09-15 MED ORDER — LIDOCAINE 2% (20 MG/ML) 5 ML SYRINGE
INTRAMUSCULAR | Status: DC | PRN
  Administered 2020-09-15: 100 mg via INTRAVENOUS

## 2020-09-15 MED ORDER — ONDANSETRON HCL 4 MG/2ML IJ SOLN
INTRAMUSCULAR | Status: DC | PRN
  Administered 2020-09-15: 4 mg via INTRAVENOUS

## 2020-09-15 MED ORDER — SODIUM CHLORIDE 0.9% FLUSH: 3.0000 mL | Freq: Two times a day (BID) | INTRAVENOUS | Status: DC

## 2020-09-15 MED ORDER — PROPOFOL 10 MG/ML IV BOLUS
INTRAVENOUS | Status: DC | PRN
  Administered 2020-09-15: 170 mg via INTRAVENOUS

## 2020-09-15 MED ORDER — SODIUM CHLORIDE 0.9 % IV SOLN: INTRAVENOUS | Status: DC

## 2020-09-15 MED ORDER — SODIUM CHLORIDE 0.9% FLUSH: 3.0000 mL | INTRAVENOUS | Status: DC | PRN

## 2020-09-15 MED ORDER — HEPARIN SODIUM (PORCINE) 1000 UNIT/ML IJ SOLN
INTRAMUSCULAR | Status: DC | PRN
  Administered 2020-09-15 (×2): 6000 [IU] via INTRAVENOUS
  Administered 2020-09-15: 13000 [IU] via INTRAVENOUS

## 2020-09-15 MED ORDER — PHENYLEPHRINE HCL-NACL 10-0.9 MG/250ML-% IV SOLN
INTRAVENOUS | Status: DC | PRN
  Administered 2020-09-15: 50 ug/min via INTRAVENOUS

## 2020-09-15 MED ORDER — HEPARIN SODIUM (PORCINE) 1000 UNIT/ML IJ SOLN
INTRAMUSCULAR | Status: AC
  Filled 2020-09-15: qty 1

## 2020-09-15 MED ORDER — PROTAMINE SULFATE 10 MG/ML IV SOLN
INTRAVENOUS | Status: DC | PRN
  Administered 2020-09-15 (×3): 10 mg via INTRAVENOUS

## 2020-09-15 MED ORDER — HEPARIN SODIUM (PORCINE) 1000 UNIT/ML IJ SOLN
INTRAMUSCULAR | Status: DC | PRN
  Administered 2020-09-15: 1000 [IU] via INTRAVENOUS

## 2020-09-15 MED ORDER — SUGAMMADEX SODIUM 200 MG/2ML IV SOLN
INTRAVENOUS | Status: DC | PRN
  Administered 2020-09-15: 100 mg via INTRAVENOUS
  Administered 2020-09-15: 70 mg via INTRAVENOUS

## 2020-09-15 MED ORDER — HEPARIN (PORCINE) IN NACL 1000-0.9 UT/500ML-% IV SOLN
INTRAVENOUS | Status: DC | PRN
  Administered 2020-09-15 (×3): 500 mL

## 2020-09-15 MED ORDER — FENTANYL CITRATE (PF) 100 MCG/2ML IJ SOLN
INTRAMUSCULAR | Status: DC | PRN
  Administered 2020-09-15: 100 ug via INTRAVENOUS

## 2020-09-15 SURGICAL SUPPLY — 20 items
BLANKET WARM UNDERBOD FULL ACC (MISCELLANEOUS) ×2 IMPLANT
CATH 8FR REPROCESSED SOUNDSTAR (CATHETERS) ×2 IMPLANT
CATH MAPPNG PENTARAY F 2-6-2MM (CATHETERS) ×1 IMPLANT
CATH S CIRCA THERM PROBE 10F (CATHETERS) ×2 IMPLANT
CATH SMTCH THERMOCOOL SF DF (CATHETERS) ×2 IMPLANT
CATH WEBSTER BI DIR CS D-F CRV (CATHETERS) ×2 IMPLANT
CLOSURE PERCLOSE PROSTYLE (VASCULAR PRODUCTS) ×6 IMPLANT
COVER SWIFTLINK CONNECTOR (BAG) ×2 IMPLANT
MAT PREVALON FULL STRYKER (MISCELLANEOUS) ×2 IMPLANT
PACK EP LATEX FREE (CUSTOM PROCEDURE TRAY) ×1
PACK EP LF (CUSTOM PROCEDURE TRAY) ×1 IMPLANT
PAD PRO RADIOLUCENT 2001M-C (PAD) ×2 IMPLANT
PATCH CARTO3 (PAD) ×2 IMPLANT
PENTARAY F 2-6-2MM (CATHETERS) ×2
SHEATH BAYLIS TRANSSEPTAL 98CM (NEEDLE) ×2 IMPLANT
SHEATH CARTO VIZIGO SM CVD (SHEATH) ×2 IMPLANT
SHEATH PINNACLE 8F 10CM (SHEATH) ×4 IMPLANT
SHEATH PINNACLE 9F 10CM (SHEATH) ×2 IMPLANT
SHEATH PROBE COVER 6X72 (BAG) ×2 IMPLANT
TUBING SMART ABLATE COOLFLOW (TUBING) ×2 IMPLANT

## 2020-09-15 NOTE — Anesthesia Procedure Notes (Addendum)
Procedure Name: Intubation Date/Time: 09/15/2020 11:30 AM Performed by: Jenne Campus, CRNA Pre-anesthesia Checklist: Patient identified, Emergency Drugs available, Suction available and Patient being monitored Patient Re-evaluated:Patient Re-evaluated prior to induction Oxygen Delivery Method: Circle System Utilized Preoxygenation: Pre-oxygenation with 100% oxygen Induction Type: IV induction Ventilation: Mask ventilation without difficulty Laryngoscope Size: Miller and 3 Grade View: Grade I Tube type: Oral Tube size: 7.5 mm Number of attempts: 1 Airway Equipment and Method: Stylet Placement Confirmation: ETT inserted through vocal cords under direct vision,  positive ETCO2 and breath sounds checked- equal and bilateral Secured at: 23 cm Tube secured with: Tape Dental Injury: Teeth and Oropharynx as per pre-operative assessment

## 2020-09-15 NOTE — Anesthesia Postprocedure Evaluation (Signed)
Anesthesia Post Note  Patient: Wesley Matthews  Procedure(s) Performed: ATRIAL FIBRILLATION ABLATION (N/A )     Patient location during evaluation: PACU Anesthesia Type: General Level of consciousness: awake and alert and oriented Pain management: pain level controlled Vital Signs Assessment: post-procedure vital signs reviewed and stable Respiratory status: spontaneous breathing, nonlabored ventilation and respiratory function stable Cardiovascular status: blood pressure returned to baseline Postop Assessment: no apparent nausea or vomiting Anesthetic complications: no   No complications documented.      Brennan Bailey

## 2020-09-15 NOTE — H&P (Signed)
Electrophysiology Office Note:    Date:  06/02/2020   ID:  Wesley Matthews, DOB 02-28-1946, MRN [id number]  PCP:  Kathyrn Lass, MD         [facility] Cardiologist:  No primary care provider on file.  [facility] Electrophysiologist:  Vickie Epley, MD   Referring MD: Geralynn Rile, *   Chief Complaint: Atrial flutter  History of Present Illness:    Wesley Matthews is a 75 y.o. male who presents for an evaluation of atrial flutter at the request of Dr. Audie Box. Their medical history includes diabetes, GERD, hypertension and hyperlipidemia.  Patient was last seen by Dr. Audie Box on May 27, 2020 for atrial flutter.  He has previously been cardioverted with TEE on May 14, 2020.  He was successfully cardioverted to sinus rhythm but at the time of the appoint with Dr. Audie Box on for overnight, was back in atrial flutter.  He has rapid ventricular rates while in atrial flutter because of 2-1 conduction.  His CHA2DS2-VASc is 3 for diabetes, age and hypertension.  He is maintained on Eliquis without bleeding issues.  He is very active.  He plays golf and walks routinely.  He tells me he really does not appreciate any symptoms with his atrial flutter.  He was first picked up on in the fall of last year on his blood pressure cuff.  He tells me that he frequently minimizes symptoms and that he may have some episodes of lightheadedness that may be attributable to the atrial flutter.       Past Medical History:  Diagnosis Date  . BPH (benign prostatic hyperplasia)   . Diabetes mellitus without complication ([place])   . ED (erectile dysfunction)   . GERD (gastroesophageal reflux disease)   . Hyperlipidemia   . Hypertension          Past Surgical History:  Procedure Laterality Date  . 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 CARDIOVERSION N/A 05/14/2020   Procedure: CARDIOVERSION;  Surgeon: Lelon Perla, MD;  Location: [facility] ENDOSCOPY;  Service:  Cardiovascular;  Laterality: N/A;  . TEE WITHOUT CARDIOVERSION N/A 05/14/2020   Procedure: TRANSESOPHAGEAL ECHOCARDIOGRAM (TEE);  Surgeon: Lelon Perla, MD;  Location: [facility] ENDOSCOPY;  Service: Cardiovascular;  Laterality: N/A;    Current Medications: Active Medications  No outpatient medications have been marked as taking for the 06/02/20 encounter (Office Visit) with Vickie Epley, MD.       Allergies:   Fire ant   Social History        Socioeconomic History  . Marital status: Married    Spouse name: Not on file  . Number of children: 3  . Years of education: Not on file  . Highest education level: Not on file  Occupational History  . Occupation: Arts administrator  Tobacco Use  . Smoking status: Never Smoker  . Smokeless tobacco: Never Used  Substance and Sexual Activity  . Alcohol use: Yes    Comment: 3-4 glasses wine / week  . Drug use: Never  . Sexual activity: Not on file  Other Topics Concern  . Not on file  Social History Narrative  . Not on file   Social Determinants of Health   Financial Resource Strain: Not on file  Food Insecurity: Not on file  Transportation Needs: Not on file  Physical Activity: Not on file  Stress: Not on file  Social Connections: Not on file     Family History: The patient's family  history includes Heart disease in his father and mother.  ROS:   Please see the history of present illness.    All other systems reviewed and are negative.  EKGs/Labs/Other Studies Reviewed:    The following studies were reviewed today:  May 26, 2020 echo personally reviewed Left ventricular function normal, 60% Mild LVH Right ventricular function normal No significant valvular abnormalities   05/27/2020 ECG  2-1 atrial flutter.  Right bundle-branch block, left anterior fascicular block.   EKG:  The ekg ordered today demonstrates typical appearing atrial flutter with 2-1 AV conduction.  Right bundle  branch block and a left anterior fascicular block.  Recent Labs: 05/12/2020: ALT 20; BUN 19; Creatinine, Ser 0.96; Hemoglobin 16.2; Platelets 267; Potassium 5.1; Sodium 140; TSH 3.970  Recent Lipid Panel Labs (Brief)  No results found for: CHOL, TRIG, HDL, CHOLHDL, VLDL, LDLCALC, LDLDIRECT    Physical Exam:    VS:  BP 114/74   Pulse (!) 147   Ht 5\' 10"  (1.778 m)   Wt 193 lb (87.5 kg)   SpO2 97%   BMI 27.69 kg/m        Wt Readings from Last 3 Encounters:  06/02/20 193 lb (87.5 kg)  05/27/20 192 lb 9.6 oz (87.4 kg)  05/14/20 190 lb 9.6 oz (86.5 kg)     GEN:  Well nourished, well developed in no acute distress.  Appears younger than stated age 71: Normal NECK: No JVD; No carotid bruits LYMPHATICS: No lymphadenopathy CARDIAC: Regular rhythm, tachycardic., no murmurs, rubs, gallops RESPIRATORY:  Clear to auscultation without rales, wheezing or rhonchi  ABDOMEN: Soft, non-tender, non-distended MUSCULOSKELETAL: Trace bilateral lower extremity pitting edema; No deformity  SKIN: Warm and dry NEUROLOGIC:  Alert and oriented x 3 PSYCHIATRIC:  Normal affect   ASSESSMENT:    1. Typical atrial flutter (HCC)    PLAN:    In order of problems listed above:  1. Typical atrial flutter Patient has relatively asymptomatic typical atrial flutter with a right bundle branch block and left anterior fascicular block.  Unfortunately he has had very difficult to control heart rates and is locked into a heart rate of 150 bpm.  Although he is relatively asymptomatic, he does tell me that at times he will have brief periods of lightheadedness.  I discussed the available treatment options including antiarrhythmic therapy and ablation with the patient.  We discussed the efficacy of both options.  We specifically discussed the possibility of atrial fibrillation developing after an atrial flutter ablation.  We would plan on performing an atrial flutter ablation now and after 3 months,  implanting a loop recorder for ongoing surveillance for atrial fibrillation. If no recurrence of atrial flutter in the 3 months following the ablation, could stop the anticoagulant.   He would like to proceed with scheduling a typical flutter ablation.  Risk, benefits, and alternatives to EP study and radiofrequency ablation for atrial flutter were also discussed in detail today. These risks include but are not limited to stroke, bleeding, vascular damage, tamponade, perforation, damage to the esophagus, lungs, and other structures, pulmonary vein stenosis, worsening renal function, and death. The patient understands these risk and wishes to proceed.  We will therefore proceed with catheter ablation at the next available time.  Carto, ICE, anesthesia are requested for the procedure.    He will continue taking Eliquis.     ----------------------------------------------------------------  I have seen, examined the patient, and reviewed the above assessment and plan.    Plan for PVI and  CTI ablation.  Vickie Epley, MD 09/15/2020 10:37 AM

## 2020-09-15 NOTE — Transfer of Care (Signed)
Immediate Anesthesia Transfer of Care Note  Patient: Wesley Matthews  Procedure(s) Performed: ATRIAL FIBRILLATION ABLATION (N/A )  Patient Location: Cath Lab  Anesthesia Type:General  Level of Consciousness: awake, oriented and patient cooperative  Airway & Oxygen Therapy: Patient Spontanous Breathing and Patient connected to nasal cannula oxygen  Post-op Assessment: Report given to RN and Post -op Vital signs reviewed and stable  Post vital signs: Reviewed  Last Vitals:  Vitals Value Taken Time  BP    Temp    Pulse 91 09/15/20 1412  Resp    SpO2 95 % 09/15/20 1412  Vitals shown include unvalidated device data.  Last Pain:  Vitals:   09/15/20 0922  TempSrc:   PainSc: 0-No pain      Patients Stated Pain Goal: 3 (35/00/93 8182)  Complications: No complications documented.

## 2020-09-15 NOTE — Discharge Instructions (Signed)
Cardiac Ablation, Care After This sheet gives you information about how to care for yourself after your procedure. Your health care provider may also give you more specific instructions. If you have problems or questions, contact your health care provider. What can I expect after the procedure? After the procedure, it is common to have:  Bruising around the insertion site.  Tenderness around the insertion site.  Skipped heartbeats.  Tiredness (fatigue). Follow these instructions at home: Insertion site care  Follow instructions from your health care provider about how to take care of your insertion site. Make sure you: ? Wash your hands with soap and water for at least 20 seconds before and after you change your bandage (dressing). If soap and water are not available, use hand sanitizer. ? Change your dressing as told by your health care provider. ? Leave stitches (sutures), skin glue, or adhesive strips in place. These skin closures may need to stay in place for up to 2 weeks. If adhesive strip edges start to loosen and curl up, you may trim the loose edges. Do not remove adhesive strips completely unless your health care provider tells you to do that.  Check your insertion site every day for signs of infection. Check for: ? More redness, swelling, or pain. ? Fluid or blood. ? Warmth. ? Pus or a bad smell.  If your insertion site starts to bleed, lie down on your back, apply firm pressure to the area, and contact your health care provider.   Driving  If you were given a sedative during the procedure, it can affect you for several hours. Do not drive or operate machinery until your health care provider says that it is safe.  Ask your health care provider when it is safe for you to drive again after the procedure.   Activity  Avoid activities that take a lot of effort for at least 3 days after your procedure.  Do not lift anything that is heavier than 10 lb (4.5 kg), or the limit  that you are told, until your health care provider says that it is safe.  Return to your normal activities as told by your health care provider. Ask your health care provider what activities are safe for you.   General instructions  Take over-the-counter and prescription medicines only as told by your health care provider.  Do not use any products that contain nicotine or tobacco, such as cigarettes, e-cigarettes, and chewing tobacco. If you need help quitting, ask your health care provider.  Do not take baths, swim, or use a hot tub until your health care provider approves. Ask your health care provider if you may take showers. You may only be allowed to take sponge baths.  Do not drink alcohol for 24 hours after your procedure.  Keep all follow-up visits as told by your health care provider. This is important. Contact a health care provider if you:  Notice these things around the catheter insertion site: ? More redness, swelling, or pain. ? Fluid or blood that stops after applying firm pressure to the area. ? Warmth when you touch the area. ? Pus or a bad smell.  Have a fever.  Are sweating a lot.  Feel nauseous.  Have pain or numbness in the arm or leg closest to your insertion site. Get help right away if:  Your insertion site suddenly swells.  Your insertion site is bleeding and the bleeding does not stop after applying firm pressure to the area.  You  have chest pain or discomfort that spreads to your neck, jaw, or arm.  You have a fast or irregular heartbeat.  You have shortness of breath.  You are dizzy or light-headed and feel the need to lie down. These symptoms may represent a serious problem that is an emergency. Do not wait to see if the symptoms will go away. Get medical help right away. Call your local emergency services (911 in the [place] Do not drive yourself to the hospital. Summary  After the procedure, it is common to have bruising and tenderness at the  insertion site in your groin or your neck.  Check your insertion site every day for more redness, swelling, or pain. These are signs of infection.  Contact a health care provider if you notice any signs of infection. Also, contact a health care provider if you start sweating a lot, feel nauseous, or have pain or numbness in the arm or leg closest to your insertion site.  Get help right away if your puncture site is bleeding and the bleeding does not stop after applying firm pressure to the area. This is a medical emergency. This information is not intended to replace advice given to you by your health care provider. Make sure you discuss any questions you have with your health care provider. Document Revised: 02/11/2019 Document Reviewed: 02/11/2019 Elsevier Patient Education  [place].

## 2020-09-16 ENCOUNTER — Telehealth: Payer: Self-pay | Admitting: Cardiology

## 2020-09-16 ENCOUNTER — Encounter (HOSPITAL_COMMUNITY): Payer: Self-pay | Admitting: Cardiology

## 2020-09-16 MED ORDER — COLCHICINE 0.6 MG PO TABS: 0.6000 mg | ORAL_TABLET | Freq: Two times a day (BID) | ORAL | 3 refills | Status: DC

## 2020-09-16 MED FILL — Heparin Sod (Porcine)-NaCl IV Soln 1000 Unit/500ML-0.9%: INTRAVENOUS | Qty: 500 | Status: AC

## 2020-09-16 NOTE — Telephone Encounter (Signed)
Jim with [facility] called to verify that the right medication was given to the patient.he stated that colchicine 0.6 MG tablet is suppose to be for gout. Please advise

## 2020-09-16 NOTE — Telephone Encounter (Signed)
Returned call to pharmacist.  Pharmacist states he told family colchicine was for gout only.  Told family he was surprised Pt was getting a gout medication for chest pain.    Provided education that this medication was frequently prescribed for pericarditis.  Then called family to reassure them they were receiving the correct medication.  Family was grateful for call.

## 2020-09-16 NOTE — Telephone Encounter (Signed)
Called patient.  Over the last hour pt has noticed pain in center of chest and really making an effort to take a deep breath--feels pressure with it.  Chest pain is constant and then becomes worse w deep breath.  Was taking it easy all day.    Placed pt on hold reviewing chart/waiting to speak w physician for recommendations.  When I got back on line pt reports on his way to ER and would like me to let someone know he is coming.  Spoke with Dr. Quentin Ore who is going to call the patient.

## 2020-09-16 NOTE — Telephone Encounter (Signed)
Per Dr. Quentin Ore who spoke with pt:  Rx for Colchicine 0.6 mg BID sent to [facility] on [address] in [place].

## 2020-09-24 MED ORDER — METOPROLOL SUCCINATE ER 25 MG PO TB24: 25.0000 mg | ORAL_TABLET | Freq: Every day | ORAL | 3 refills | Status: DC

## 2020-10-14 NOTE — Progress Notes (Signed)
Primary Care Physician: Kathyrn Lass, MD Primary Cardiologist: Dr Audie Box Primary Electrophysiologist: Dr Quentin Ore Referring Physician: Dr Quentin Ore   Wesley Matthews is a 75 y.o. male with a history of DM, GERD, HTN, HLD, and atrial flutter who presents for follow up in the [facility].  The patient was initially diagnosed with atrial fibrillation 04/2020 and underwent TEE guided DCCV on 05/14/20. Unfortunately, he reverted back to atrial flutter with rapid rates. Patient is on Eliquis for a CHADS2VASC score of 3. He underwent atrial flutter ablation and PVI with Dr Quentin Ore on 09/15/20. He reports he has done well since the procedure with no CP, swallowing pain, or groin issues. He did have some fatigue and his BB was decreased which helped.   Today, he denies symptoms of palpitations, chest pain, shortness of breath, orthopnea, PND, lower extremity edema, dizziness, presyncope, syncope, snoring, daytime somnolence, bleeding, or neurologic sequela. The patient is tolerating medications without difficulties and is otherwise without complaint today.    Atrial Fibrillation Risk Factors:  he does not have symptoms or diagnosis of sleep apnea. he does not have a history of rheumatic fever. he does have a history of alcohol use. The patient does not have a history of early familial atrial fibrillation or other arrhythmias.  he has a BMI of Body mass index is 27.43 kg/m.Marland Kitchen Filed Weights   10/15/20 0834  Weight: 86.7 kg    Family History  Problem Relation Age of Onset   Heart disease Mother    Heart disease Father      Atrial Fibrillation Management history:  Previous antiarrhythmic drugs: none Previous cardioversions: 05/14/20 Previous ablations: 09/15/20 PVI and CTI CHADS2VASC score: 3 Anticoagulation history: Eliquis   Past Medical History:  Diagnosis Date   BPH (benign prostatic hyperplasia)    Diabetes mellitus without complication ([place])    ED  (erectile dysfunction)    GERD (gastroesophageal reflux disease)    Hyperlipidemia    Hypertension    Past Surgical History:  Procedure Laterality Date   ATRIAL FIBRILLATION ABLATION N/A 09/15/2020   Procedure: ATRIAL FIBRILLATION ABLATION;  Surgeon: Vickie Epley, MD;  Location: [facility];  Service: Cardiovascular;  Laterality: N/A;   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 60 tablet 3   aspirin-sod bicarb-citric acid (ALKA-SELTZER) 325 MG TBEF tablet Take 325 mg by mouth at bedtime.     atorvastatin (LIPITOR) 10 MG tablet Take 10 mg by mouth at bedtime.     fluticasone (FLONASE) 50 MCG/ACT nasal spray Place 1 spray into both nostrils 2 (two) times daily.     GLUCOSAMINE-CHONDROITIN PO Take 2 tablets by mouth daily.     ibuprofen (ADVIL) 200 MG tablet Take 200 mg by mouth every 6 (six) hours as needed for mild pain or moderate pain.     JARDIANCE 10 MG TABS tablet Take 10 mg by mouth daily.     Lancets (ONETOUCH ULTRASOFT) lancets      metFORMIN (GLUCOPHAGE) 500 MG  tablet Take 1,000 mg by mouth 2 (two) times daily.     metoprolol succinate (TOPROL XL) 25 MG 24 hr tablet Take 1 tablet (25 mg total) by mouth daily. 90 tablet 3   montelukast (SINGULAIR) 10 MG tablet Take 10 mg by mouth at bedtime.     omeprazole (PRILOSEC) 20 MG capsule Take 20 mg by mouth daily with supper.     sildenafil (VIAGRA) 100 MG tablet Take 50 mg by mouth as needed for erectile dysfunction.     No  current facility-administered medications for this encounter.    Allergies  Allergen Reactions   Fire Ant Anaphylaxis    Social History   Socioeconomic History   Marital status: Married    Spouse name: Not on file   Number of children: 3   Years of education: Not on file   Highest education level: Not on file  Occupational History   Occupation: Arts administrator  Tobacco Use   Smoking status: Never   Smokeless tobacco: Never  Substance and Sexual Activity   Alcohol use: Yes    Alcohol/week: 1.0 - 2.0 standard drink    Types: 1 - 2 Glasses of wine per week    Comment: 3-4 glasses wine / week   Drug use: Never   Sexual activity: Not on file  Other Topics Concern   Not on file  Social History Narrative   Not on file   Social Determinants of Health   Financial Resource Strain: Not on file  Food Insecurity: Not on file  Transportation Needs: Not on file  Physical Activity: Not on file  Stress: Not on file  Social Connections: Not on file  Intimate Partner Violence: Not on file     ROS- All systems are reviewed and negative except as per the HPI above.  Physical Exam: Vitals:   10/15/20 0834  BP: 120/60  Pulse: 74  Weight: 86.7 kg  Height: 5\' 10"  (1.778 m)    GEN- The patient is a well appearing elderly male, alert and oriented x 3 today.   Head- normocephalic, atraumatic Eyes-  Sclera clear, conjunctiva pink Ears- hearing intact Oropharynx- clear Neck- supple  Lungs- Clear to ausculation bilaterally, normal work of breathing Heart- Regular rate and rhythm, no murmurs, rubs or gallops  GI- soft, NT, ND, + BS Extremities- no clubbing, cyanosis, or edema MS- no significant deformity or atrophy Skin- no rash or lesion Psych- euthymic mood, full affect Neuro- strength and sensation are intact  Wt Readings from Last 3 Encounters:  10/15/20 86.7 kg  09/15/20 83 kg  07/31/20 85.3 kg    EKG today demonstrates  SR, PAC, RBBB Vent. rate 74 BPM PR  interval 184 ms QRS duration 146 ms QT/QTcB 412/457 ms  Echo 05/26/20 demonstrated  1. Left ventricular ejection fraction, by estimation, is 60 to 65%. The  left ventricle has normal function. The left ventricle has no regional  wall motion abnormalities. There is mild left ventricular hypertrophy.  Left ventricular diastolic parameters  are consistent with Grade I diastolic dysfunction (impaired relaxation).   2. Right ventricular systolic function is normal. The right ventricular  size is normal.   3. The mitral valve is grossly normal. Trivial mitral valve  regurgitation.   4. The aortic valve is tricuspid. Aortic valve regurgitation is not  visualized. Mild aortic valve sclerosis is present, with no evidence of  aortic valve stenosis.   5. The inferior vena cava is normal in size with greater than 50%  respiratory variability, suggesting right atrial pressure of 3 mmHg.   Comparison(s): Changes from prior study are noted. 05/14/2020: LVEF 40-45%,  mild MR.   Epic records are reviewed at length today  CHA2DS2-VASc Score = 3  The patient's score is based upon: CHF History: No HTN History: Yes Diabetes History: Yes Stroke History: No Vascular Disease History: No Age Score: 1 Gender Score: 0     ASSESSMENT AND PLAN: 1. Typical atrial flutter The patient's CHA2DS2-VASc score is 3, indicating a 3.2% annual risk of stroke.   S/p CTI and PVI ablation 09/15/20 Patient appears to be maintaining SR. Continue Toprol 25 mg daily Continue Eliquis 5 mg BID  2. Secondary Hypercoagulable State (ICD10:  D68.69) The patient is at significant risk for stroke/thromboembolism based upon his CHA2DS2-VASc Score of 3.  Continue Apixaban (Eliquis).   3. HTN Stable, no changes today.   Follow up with Dr Quentin Ore as scheduled.    [facility] [address] [place], [place] [address] [phone] 10/15/2020 8:44 AM

## 2020-10-15 ENCOUNTER — Encounter (HOSPITAL_COMMUNITY): Payer: Self-pay | Admitting: Physician Assistant

## 2020-10-15 ENCOUNTER — Other Ambulatory Visit: Payer: Self-pay

## 2020-10-15 ENCOUNTER — Ambulatory Visit (HOSPITAL_COMMUNITY)
Admission: RE | Admit: 2020-10-15 | Discharge: 2020-10-15 | Disposition: A | Discharge status: Home / Self Care | Payer: Medicare Other | Source: Ambulatory Visit | Attending: Physician Assistant | Admitting: Physician Assistant

## 2020-10-15 VITALS — BP 120/60 | HR 74 | Ht 70.0 in | Wt 191.2 lb

## 2020-10-15 DIAGNOSIS — I483 Typical atrial flutter: Secondary | ICD-10-CM | POA: Insufficient documentation

## 2020-10-15 DIAGNOSIS — Z8249 Family history of ischemic heart disease and other diseases of the circulatory system: Secondary | ICD-10-CM | POA: Diagnosis not present

## 2020-10-15 DIAGNOSIS — D6869 Other thrombophilia: Secondary | ICD-10-CM | POA: Insufficient documentation

## 2020-10-15 DIAGNOSIS — Z7982 Long term (current) use of aspirin: Secondary | ICD-10-CM | POA: Diagnosis not present

## 2020-10-15 DIAGNOSIS — Z7901 Long term (current) use of anticoagulants: Secondary | ICD-10-CM | POA: Diagnosis not present

## 2020-10-15 DIAGNOSIS — E785 Hyperlipidemia, unspecified: Secondary | ICD-10-CM | POA: Diagnosis not present

## 2020-10-15 DIAGNOSIS — Z7984 Long term (current) use of oral hypoglycemic drugs: Secondary | ICD-10-CM | POA: Diagnosis not present

## 2020-10-15 DIAGNOSIS — K219 Gastro-esophageal reflux disease without esophagitis: Secondary | ICD-10-CM | POA: Diagnosis not present

## 2020-10-15 DIAGNOSIS — E119 Type 2 diabetes mellitus without complications: Secondary | ICD-10-CM | POA: Insufficient documentation

## 2020-10-15 DIAGNOSIS — Z79899 Other long term (current) drug therapy: Secondary | ICD-10-CM | POA: Diagnosis not present

## 2020-10-15 DIAGNOSIS — I10 Essential (primary) hypertension: Secondary | ICD-10-CM | POA: Insufficient documentation

## 2020-10-30 ENCOUNTER — Ambulatory Visit: Payer: Medicare Other | Admitting: Cardiology

## 2020-11-20 ENCOUNTER — Ambulatory Visit: Payer: Medicare Other | Admitting: Cardiology

## 2020-12-15 ENCOUNTER — Encounter: Payer: Self-pay | Admitting: Cardiology

## 2020-12-15 ENCOUNTER — Other Ambulatory Visit: Payer: Self-pay

## 2020-12-15 ENCOUNTER — Ambulatory Visit: Payer: Medicare Other | Admitting: Cardiology

## 2020-12-15 VITALS — BP 130/66 | HR 60 | Ht 70.0 in | Wt 192.0 lb

## 2020-12-15 DIAGNOSIS — I483 Typical atrial flutter: Secondary | ICD-10-CM

## 2020-12-15 DIAGNOSIS — I480 Paroxysmal atrial fibrillation: Secondary | ICD-10-CM | POA: Diagnosis not present

## 2020-12-15 NOTE — Patient Instructions (Addendum)
Medication Instructions:  Your physician recommends that you continue on your current medications as directed. Please refer to the Current Medication list given to you today.  Labwork: None ordered.  Testing/Procedures: None ordered.  Follow-Up: Your physician wants you to follow-up in: 12 months with  Dr. Hunt Oris will receive a reminder letter in the mail two months in advance. If you don't receive a letter, please call our office to schedule the follow-up appointment.   Any Other Special Instructions Will Be Listed Below (If Applicable).  If you need a refill on your cardiac medications before your next appointment, please call your pharmacy.

## 2020-12-15 NOTE — Progress Notes (Signed)
Electrophysiology Office Follow up Visit Note:    Date:  12/15/2020   ID:  Wesley Matthews, DOB April 07, 1946, MRN [id number]  PCP:  Kathyrn Lass, MD  [facility] Cardiologist:  None  [facility] Electrophysiologist:  Vickie Epley, MD    Interval History:    Wesley Matthews is a 75 y.o. male who presents for a follow up visit.  He underwent a successful atrial fibrillation and flutter ablation on Sep 15, 2020.  Since the procedure he has not had a recurrence of his arrhythmia.  He monitors his heart rate on an almost daily basis.  He did have some pericarditis after the procedure which resolved with colchicine.     Past Medical History:  Diagnosis Date   BPH (benign prostatic hyperplasia)    Diabetes mellitus without complication ([place])    ED (erectile dysfunction)    GERD (gastroesophageal reflux disease)    Hyperlipidemia    Hypertension     Past Surgical History:  Procedure Laterality Date   ATRIAL FIBRILLATION ABLATION N/A 09/15/2020   Procedure: ATRIAL FIBRILLATION ABLATION;  Surgeon: Vickie Epley, MD;  Location: [place] 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   aspirin-sod bicarb-citric acid (ALKA-SELTZER) 325 MG TBEF tablet Take 325 mg by mouth at bedtime.   atorvastatin (LIPITOR) 10 MG tablet Take 10 mg by mouth at bedtime.   fluticasone (FLONASE) 50 MCG/ACT nasal spray Place 1 spray into both  nostrils 2 (two) times daily.   GLUCOSAMINE-CHONDROITIN PO Take 2 tablets by mouth daily.   ibuprofen (ADVIL) 200 MG tablet Take 200 mg by mouth every 6 (six) hours as needed for mild pain or moderate pain.   JARDIANCE 10 MG TABS tablet Take 10 mg by mouth daily.   Lancets (ONETOUCH ULTRASOFT) lancets    metFORMIN (GLUCOPHAGE) 500 MG tablet Take 1,000 mg by mouth 2 (two) times daily.   metoprolol succinate (TOPROL XL) 25 MG 24 hr tablet Take 1 tablet (25 mg total) by mouth daily.   montelukast (SINGULAIR) 10 MG tablet Take 10 mg by mouth at bedtime.   omeprazole (PRILOSEC) 20 MG capsule Take 20 mg by mouth daily with supper.   sildenafil (VIAGRA) 100 MG tablet Take 50 mg by mouth as needed for erectile dysfunction.     Allergies:   Catering manager   Social History   Socioeconomic History   Marital status: Married    Spouse name: Not on file   Number of children: 3   Years of education: Not on file   Highest education level: Not on file  Occupational History   Occupation: Arts administrator  Tobacco Use   Smoking status: Never   Smokeless tobacco: Never  Substance and Sexual Activity   Alcohol use: Yes    Alcohol/week: 1.0 - 2.0 standard drink    Types: 1 - 2 Glasses of wine per week    Comment: 3-4 glasses wine / week   Drug  use: Never   Sexual activity: Not on file  Other Topics Concern   Not on file  Social History Narrative   Not on file   Social Determinants of Health   Financial Resource Strain: Not on file  Food Insecurity: Not on file  Transportation Needs: Not on file  Physical Activity: Not on file  Stress: Not on file  Social Connections: Not on file     Family History: The patient's family history includes Heart disease in his father and mother.  ROS:   Please see the history of present illness.    All other systems reviewed and are negative.  EKGs/Labs/Other Studies Reviewed:    The following studies were reviewed today: Ablation records  EKG:  The  ekg ordered today demonstrates sinus rhythm.  Right bundle branch block.  Left anterior fascicular block.  PR interval is 196 ms.  Recent Labs: 05/12/2020: ALT 20; TSH 3.970 09/15/2020: BUN 19; Creatinine, Ser 0.91; Hemoglobin 15.2; Platelets 195; Potassium 4.4; Sodium 139  Recent Lipid Panel No results found for: CHOL, TRIG, HDL, CHOLHDL, VLDL, LDLCALC, LDLDIRECT  Physical Exam:    VS:  BP 130/66   Pulse 60   Ht '5\' 10"'$  (1.778 m)   Wt 192 lb (87.1 kg)   BMI 27.55 kg/m     Wt Readings from Last 3 Encounters:  12/15/20 192 lb (87.1 kg)  10/15/20 191 lb 3.2 oz (86.7 kg)  09/15/20 183 lb (83 kg)     GEN:  Well nourished, well developed in no acute distress HEENT: Normal NECK: No JVD; No carotid bruits LYMPHATICS: No lymphadenopathy CARDIAC: RRR, no murmurs, rubs, gallops RESPIRATORY:  Clear to auscultation without rales, wheezing or rhonchi  ABDOMEN: Soft, non-tender, non-distended MUSCULOSKELETAL:  No edema; No deformity  SKIN: Warm and dry NEUROLOGIC:  Alert and oriented x 3 PSYCHIATRIC:  Normal affect   ASSESSMENT:    1. Typical atrial flutter (HCC)   2. Paroxysmal atrial fibrillation (HCC)    PLAN:    In order of problems listed above:   1. Typical atrial flutter (HCC) 2. Paroxysmal atrial fibrillation (HCC)  Maintaining sinus rhythm after his ablation on May 31.  Advised him to continue to monitor his heart rates 1-2 times per week.  I asked him to continue taking Eliquis 5 mg by mouth twice daily for stroke prophylaxis.  I will plan to see him back in 1 year or sooner as needed.    Follow-up 1 year.   Medication Adjustments/Labs and Tests Ordered: Current medicines are reviewed at length with the patient today.  Concerns regarding medicines are outlined above.  Orders Placed This Encounter  Procedures   EKG 12-Lead   No orders of the defined types were placed in this encounter.    Signed, Lars Mage, MD, [facility], [facility] 12/15/2020 9:11 AM     Electrophysiology [facility]

## 2021-01-07 ENCOUNTER — Other Ambulatory Visit: Payer: Self-pay | Admitting: Cardiovascular Disease

## 2021-01-07 NOTE — Telephone Encounter (Signed)
Prescription refill request for Eliquis received. Last office visit:LAMBERT 12/15/20 Scr:0.91 09/05/20 Age: 68M Weight:87.1KG

## 2021-03-01 DIAGNOSIS — I10 Essential (primary) hypertension: Secondary | ICD-10-CM | POA: Diagnosis not present

## 2021-03-01 DIAGNOSIS — E1140 Type 2 diabetes mellitus with diabetic neuropathy, unspecified: Secondary | ICD-10-CM | POA: Diagnosis not present

## 2021-03-01 DIAGNOSIS — E781 Pure hyperglyceridemia: Secondary | ICD-10-CM | POA: Diagnosis not present

## 2021-03-05 DIAGNOSIS — I700 Atherosclerosis of aorta: Secondary | ICD-10-CM | POA: Diagnosis not present

## 2021-03-05 DIAGNOSIS — E1140 Type 2 diabetes mellitus with diabetic neuropathy, unspecified: Secondary | ICD-10-CM | POA: Diagnosis not present

## 2021-03-05 DIAGNOSIS — Z0000 Encounter for general adult medical examination without abnormal findings: Secondary | ICD-10-CM | POA: Diagnosis not present

## 2021-03-05 DIAGNOSIS — K219 Gastro-esophageal reflux disease without esophagitis: Secondary | ICD-10-CM | POA: Diagnosis not present

## 2021-03-05 DIAGNOSIS — J309 Allergic rhinitis, unspecified: Secondary | ICD-10-CM | POA: Diagnosis not present

## 2021-03-05 DIAGNOSIS — Z86010 Personal history of colonic polyps: Secondary | ICD-10-CM | POA: Diagnosis not present

## 2021-03-05 DIAGNOSIS — D6869 Other thrombophilia: Secondary | ICD-10-CM | POA: Diagnosis not present

## 2021-03-05 DIAGNOSIS — E781 Pure hyperglyceridemia: Secondary | ICD-10-CM | POA: Diagnosis not present

## 2021-07-02 DIAGNOSIS — H2513 Age-related nuclear cataract, bilateral: Secondary | ICD-10-CM | POA: Diagnosis not present

## 2021-07-02 DIAGNOSIS — H524 Presbyopia: Secondary | ICD-10-CM | POA: Diagnosis not present

## 2021-07-02 DIAGNOSIS — E119 Type 2 diabetes mellitus without complications: Secondary | ICD-10-CM | POA: Diagnosis not present

## 2021-07-05 ENCOUNTER — Other Ambulatory Visit: Payer: Self-pay | Admitting: Cardiology

## 2021-07-05 DIAGNOSIS — I480 Paroxysmal atrial fibrillation: Secondary | ICD-10-CM

## 2021-07-05 DIAGNOSIS — I4892 Unspecified atrial flutter: Secondary | ICD-10-CM

## 2021-07-05 NOTE — Telephone Encounter (Signed)
Eliquis '5mg'$  refill request received. Patient is 76 years old, weight-87.1kg, Crea-0.85 on 03/01/2021 via [facility] from [place], Diagnosis-Afib/Aflutter, and last seen by Dr. Quentin Ore on 12/15/2020. Dose is appropriate based on dosing criteria. Will send in refill to requested pharmacy.   ?

## 2021-07-27 ENCOUNTER — Other Ambulatory Visit: Payer: Self-pay | Admitting: Cardiovascular Disease

## 2021-08-24 DIAGNOSIS — D225 Melanocytic nevi of trunk: Secondary | ICD-10-CM | POA: Diagnosis not present

## 2021-08-24 DIAGNOSIS — C44519 Basal cell carcinoma of skin of other part of trunk: Secondary | ICD-10-CM | POA: Diagnosis not present

## 2021-08-24 DIAGNOSIS — L570 Actinic keratosis: Secondary | ICD-10-CM | POA: Diagnosis not present

## 2021-08-24 DIAGNOSIS — L820 Inflamed seborrheic keratosis: Secondary | ICD-10-CM | POA: Diagnosis not present

## 2021-08-24 DIAGNOSIS — Z85828 Personal history of other malignant neoplasm of skin: Secondary | ICD-10-CM | POA: Diagnosis not present

## 2021-08-24 DIAGNOSIS — L603 Nail dystrophy: Secondary | ICD-10-CM | POA: Diagnosis not present

## 2021-08-24 DIAGNOSIS — L821 Other seborrheic keratosis: Secondary | ICD-10-CM | POA: Diagnosis not present

## 2021-09-05 IMAGING — MR MR ABDOMEN WO/W CM
19 of 20 series · 47 of 48 positions shown · IV contrast (Gadavist)
Comparison: Cardiac CT scan 08/03/2020

CLINICAL DATA: Evaluate upper pole right renal lesion.

EXAM:
MRI ABDOMEN WITHOUT AND WITH CONTRAST
TECHNIQUE: Multiplanar multisequence MR imaging of the abdomen was performed
both before and after the administration of intravenous contrast.
CONTRAST:  8.5mL GADAVIST GADOBUTROL 1 MMOL/ML IV SOLN

[Series 4: cor haste · coronal · 6.0mm · 1.19mm/px · 2 of 35 slices shown]
[im 1/35]
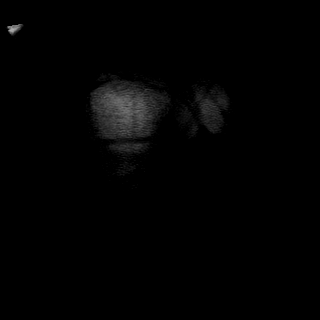
[im 35/35]
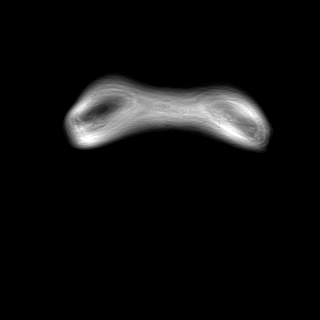

[Series 5: ax haste · axial · 6.0mm · 1.19mm/px · 1 of 37 slices shown (1 of 2)]
[im 1/37]
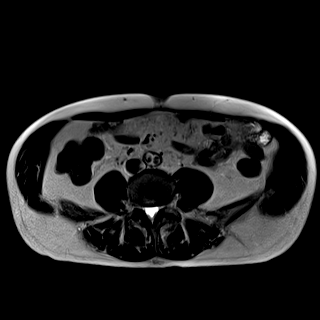

[Series 6: ax haste · axial · 6.0mm · 1.19mm/px · 1 of 39 slices shown (2 of 2)]
[im 1/39]
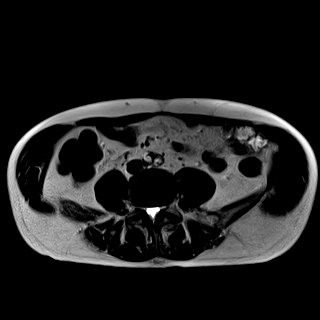

[Series 9: T2 fat-sat · axial · 6.0mm · 1.19mm/px · 1 of 39 slices shown]
[im 1/39]
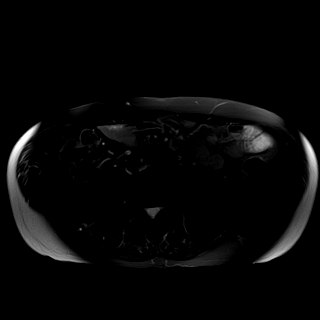

[Series 10: t1_vibe_opp-in_tra_p4_bh · axial · 3.0mm · 1.19mm/px · z∈[-18,+243]mm · 3 of 88 slices shown (1 of 2)]
[im 1/88]
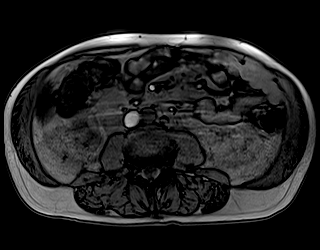
[im 44/88]
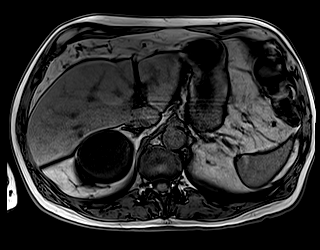
[im 88/88]
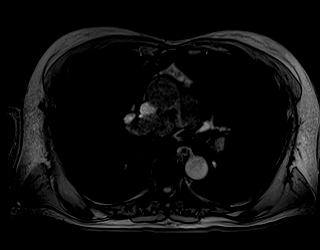

[Series 10: t1_vibe_opp-in_tra_p4_bh · axial · 3.0mm · 1.19mm/px · z∈[-18,+243]mm · 3 of 88 slices shown (2 of 2)]
[im 1/88]
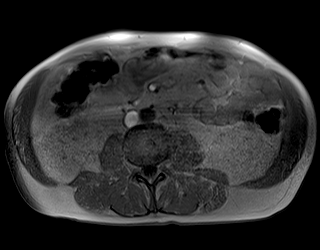
[im 44/88]
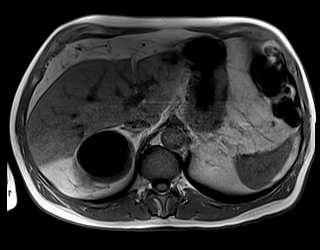
[im 88/88]
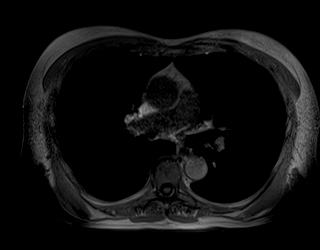

[Series 11: DWI · axial · 6.0mm · 1.42mm/px · z∈[-44,+230]mm · 4 of 117 slices shown (1 of 2)]
[im 1/117]
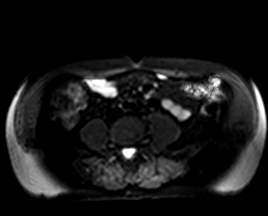
[im 39/117]
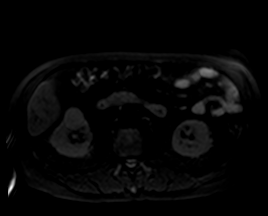
[im 78/117]
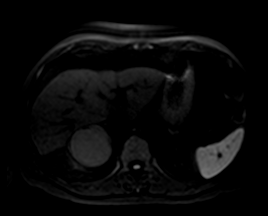
[im 117/117]
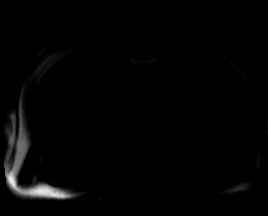

[Series 12: DWI · axial · 6.0mm · 1.42mm/px · 1 of 39 slices shown (2 of 2)]
[im 1/39]
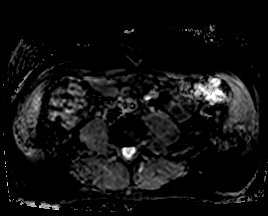

[Series 13: bSSFP · axial · 6.0mm · 0.74mm/px · 1 of 39 slices shown]
[im 1/39]
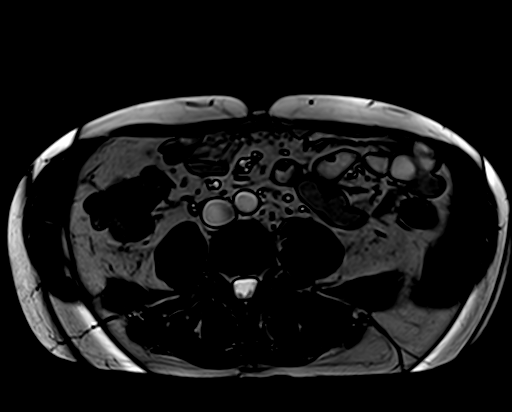

[Series 14: t1_vibe_fs_tra_p4_bh_pre · axial · 3.0mm · 1.19mm/px · z∈[-23,+238]mm · 3 of 88 slices shown]
[im 1/88]
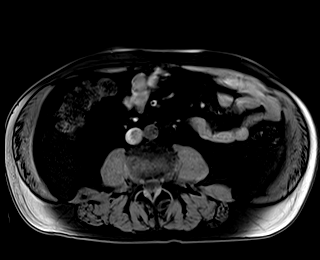
[im 44/88]
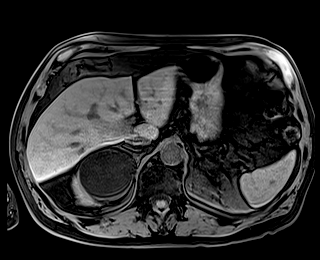
[im 88/88]
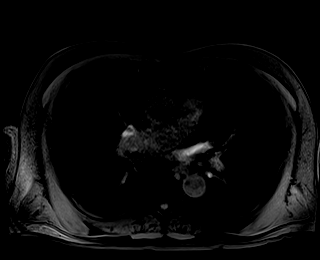

[Series 16: t1_vibe_fs_tra_p4_bh_post · axial · 3.0mm · 1.19mm/px · z∈[-23,+238]mm · 3 of 88 slices shown (1 of 4)]
[im 1/88]
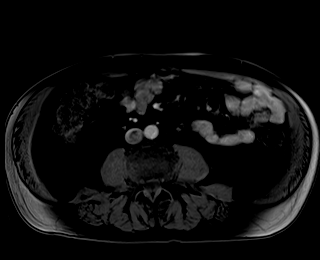
[im 44/88]
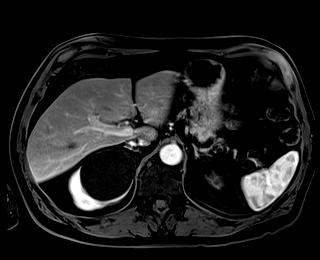
[im 88/88]
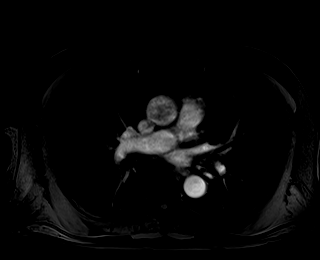

[Series 17: t1_vibe_fs_tra_p4_bh_post_sub · axial · 3.0mm · 1.19mm/px · z∈[-23,+238]mm · 3 of 88 slices shown (1 of 4)]
[im 1/88]
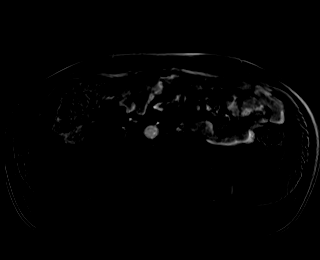
[im 44/88]
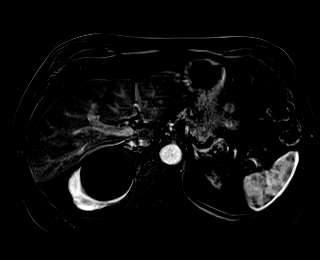
[im 88/88]
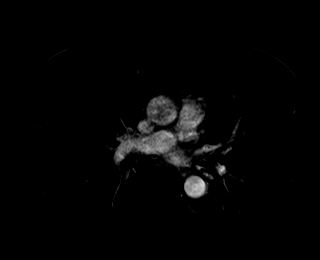

[Series 18: t1_vibe_fs_tra_p4_bh_post · axial · 3.0mm · 1.19mm/px · z∈[-23,+238]mm · 3 of 88 slices shown (2 of 4)]
[im 1/88]
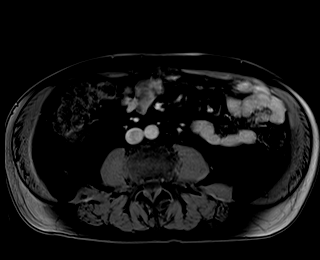
[im 44/88]
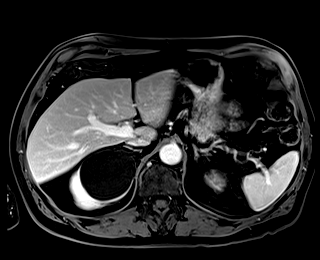
[im 88/88]
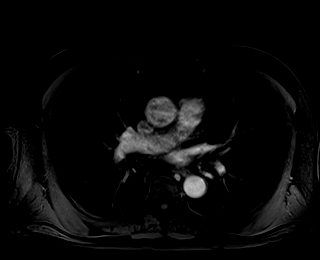

[Series 19: t1_vibe_fs_tra_p4_bh_post_sub · axial · 3.0mm · 1.19mm/px · z∈[-23,+238]mm · 3 of 88 slices shown (2 of 4)]
[im 1/88]
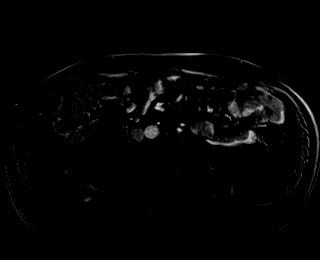
[im 44/88]
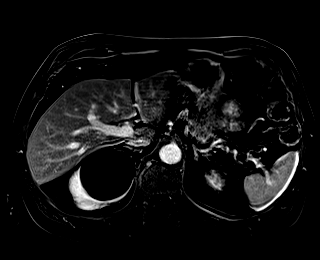
[im 88/88]
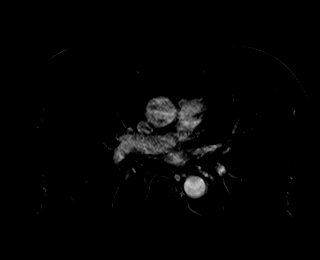

[Series 20: t1_vibe_fs_tra_p4_bh_post · axial · 3.0mm · 1.19mm/px · z∈[-23,+238]mm · 3 of 88 slices shown (3 of 4)]
[im 1/88]
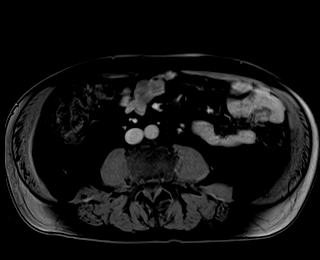
[im 44/88]
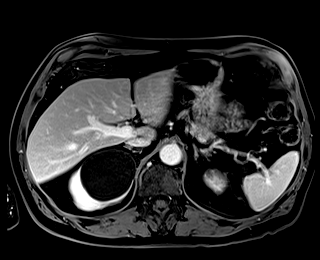
[im 88/88]
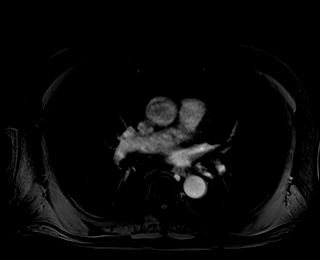

[Series 21: t1_vibe_fs_tra_p4_bh_post_sub · axial · 3.0mm · 1.19mm/px · z∈[-23,+238]mm · 3 of 88 slices shown (3 of 4)]
[im 1/88]
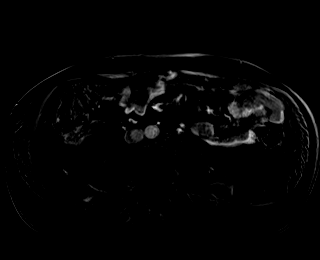
[im 44/88]
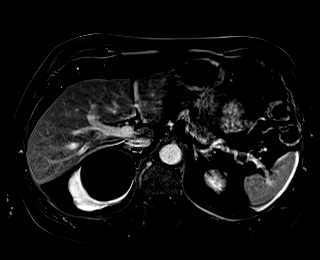
[im 88/88]
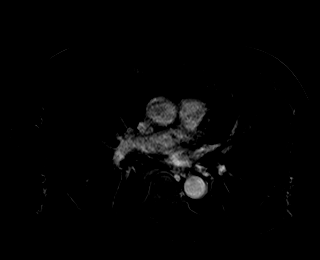

[Series 22: t1_vibe_fs_tra_p4_bh_post · axial · 3.0mm · 1.19mm/px · z∈[-23,+238]mm · 3 of 88 slices shown (4 of 4)]
[im 1/88]
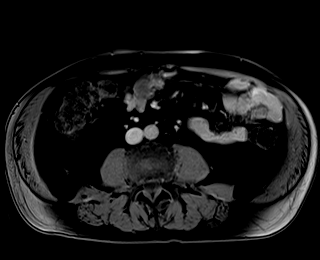
[im 44/88]
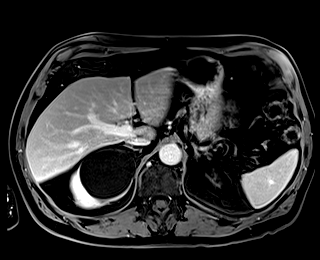
[im 88/88]
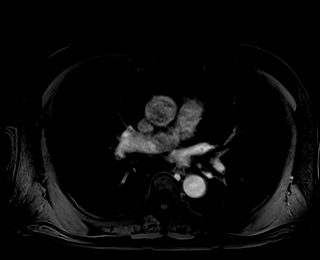

[Series 23: t1_vibe_fs_tra_p4_bh_post_sub · axial · 3.0mm · 1.19mm/px · z∈[-23,+238]mm · 3 of 88 slices shown (4 of 4)]
[im 1/88]
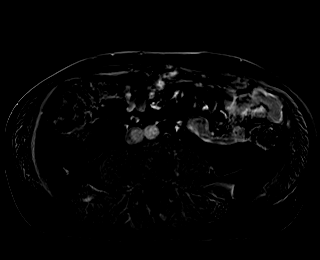
[im 44/88]
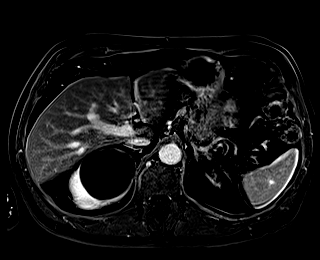
[im 88/88]
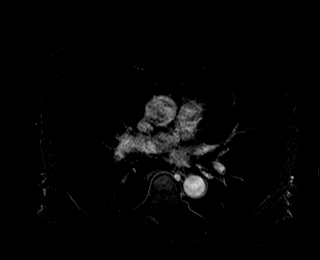

[Series 24: T1 dynamic post-contrast · coronal · 3.0mm · 1.31mm/px · 3 of 80 slices shown]
[im 1/80]
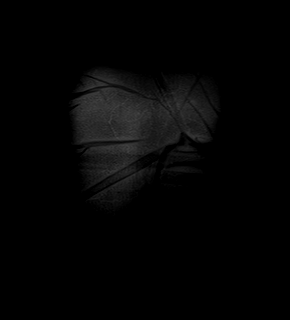
[im 40/80]
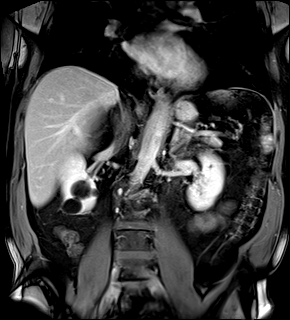
[im 80/80]
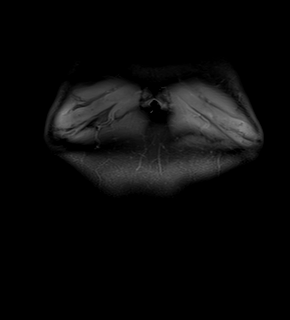

[47 of 48 positions shown; findings below may reference images not displayed]

FINDINGS: Lower chest: The lung bases are clear of an acute process. No
worrisome pulmonary lesions or pleural effusion.

Hepatobiliary: No hepatic lesions or intrahepatic biliary
dilatation. A few small gallstones are noted the gallbladder. Normal
caliber and course of the common bile duct.

Pancreas:  No mass, inflammation or ductal dilatation.

Spleen:  Normal size.  No focal lesions.

Adrenals/Urinary Tract:  The adrenal glands are normal.

There are bilateral simple nonenhancing renal cysts. The upper pole
cystic lesion on the right measures 8.1 x 6.4 x 6.8 cm. But no soft
tissue nodularity or enhancement is demonstrated.

No worrisome enhancing renal lesions.  No hydronephrosis.

Stomach/Bowel: The stomach, duodenum, visualized small bowel and
visualized colon are grossly normal. There is a small to
moderate-sized duodenal diverticulum noted near the pancreatic head.

Vascular/Lymphatic: The aorta and branch vessels are patent. The
major venous structures are patent. No mesenteric or retroperitoneal
mass or adenopathy.

Other:  No ascites or abdominal wall hernia.

Musculoskeletal: No significant bony findings.
IMPRESSION: 1. 8.1 x 6.4 x 6.8 cm right upper pole cystic lesion has a thin
septation but no nodularity or enhancement. This is a Bosniak 2
lesion. Several other Bosniak 1 lesions. No worrisome enhancing
renal lesions are identified.
2. Cholelithiasis.
3. No abdominal lymphadenopathy.

## 2021-09-07 DIAGNOSIS — E1140 Type 2 diabetes mellitus with diabetic neuropathy, unspecified: Secondary | ICD-10-CM | POA: Diagnosis not present

## 2021-09-10 ENCOUNTER — Encounter: Payer: Self-pay | Admitting: Cardiology

## 2021-09-10 DIAGNOSIS — I10 Essential (primary) hypertension: Secondary | ICD-10-CM | POA: Diagnosis not present

## 2021-09-10 DIAGNOSIS — K219 Gastro-esophageal reflux disease without esophagitis: Secondary | ICD-10-CM | POA: Diagnosis not present

## 2021-09-10 DIAGNOSIS — E781 Pure hyperglyceridemia: Secondary | ICD-10-CM | POA: Diagnosis not present

## 2021-09-10 DIAGNOSIS — I480 Paroxysmal atrial fibrillation: Secondary | ICD-10-CM | POA: Diagnosis not present

## 2021-09-10 DIAGNOSIS — E1140 Type 2 diabetes mellitus with diabetic neuropathy, unspecified: Secondary | ICD-10-CM | POA: Diagnosis not present

## 2021-09-10 DIAGNOSIS — J309 Allergic rhinitis, unspecified: Secondary | ICD-10-CM | POA: Diagnosis not present

## 2021-09-10 DIAGNOSIS — I4892 Unspecified atrial flutter: Secondary | ICD-10-CM | POA: Diagnosis not present

## 2021-09-28 DIAGNOSIS — I480 Paroxysmal atrial fibrillation: Secondary | ICD-10-CM | POA: Diagnosis not present

## 2021-09-28 DIAGNOSIS — K219 Gastro-esophageal reflux disease without esophagitis: Secondary | ICD-10-CM | POA: Diagnosis not present

## 2021-09-28 DIAGNOSIS — E781 Pure hyperglyceridemia: Secondary | ICD-10-CM | POA: Diagnosis not present

## 2021-09-28 DIAGNOSIS — I10 Essential (primary) hypertension: Secondary | ICD-10-CM | POA: Diagnosis not present

## 2021-09-28 DIAGNOSIS — E1140 Type 2 diabetes mellitus with diabetic neuropathy, unspecified: Secondary | ICD-10-CM | POA: Diagnosis not present

## 2021-11-14 DIAGNOSIS — W5501XA Bitten by cat, initial encounter: Secondary | ICD-10-CM | POA: Diagnosis not present

## 2021-11-14 DIAGNOSIS — S61451A Open bite of right hand, initial encounter: Secondary | ICD-10-CM | POA: Diagnosis not present

## 2021-11-14 DIAGNOSIS — Z23 Encounter for immunization: Secondary | ICD-10-CM | POA: Diagnosis not present

## 2021-11-15 ENCOUNTER — Encounter: Payer: Self-pay | Admitting: Cardiology

## 2021-11-15 ENCOUNTER — Other Ambulatory Visit: Payer: Self-pay | Admitting: Cardiovascular Disease

## 2021-11-15 MED ORDER — METOPROLOL SUCCINATE ER 25 MG PO TB24: 25.0000 mg | ORAL_TABLET | Freq: Every day | ORAL | 3 refills | Status: DC

## 2021-12-10 ENCOUNTER — Ambulatory Visit: Payer: Medicare Other | Admitting: Cardiology

## 2021-12-26 NOTE — Progress Notes (Unsigned)
Electrophysiology Office Follow up Visit Note:    Date:  12/26/2021   ID:  Wesley Matthews, DOB July 06, 1945, MRN [id number]  PCP:  Kathyrn Lass, MD  [facility] Cardiologist:  None  [facility] Electrophysiologist:  Vickie Epley, MD    Interval History:    Wesley Matthews is a 76 y.o. male who presents for a follow up visit. They were last seen in clinic December 15, 2020.  He underwent a successful atrial fibrillation and flutter ablation on Sep 15, 2020.  He was doing well after the procedure without any recurrence of his arrhythmia.  During his previous ablation, the veins and CTI were ablated.      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     Allergies:   Fire ant   Social History   Socioeconomic History   Marital status: Married    Spouse name: Not on file   Number of children: 3   Years of education: Not on file   Highest education level: Not on file  Occupational History   Occupation:  Arts administrator  Tobacco Use   Smoking status: Never   Smokeless tobacco: Never  Substance and Sexual Activity   Alcohol use: Yes    Alcohol/week: 1.0 - 2.0 standard drink of alcohol    Types: 1 - 2 Glasses of wine per week    Comment: 3-4 glasses wine / week   Drug use: Never   Sexual activity: Not on file  Other Topics Concern   Not on file  Social History Narrative   Not on file   Social Determinants of Health   Financial Resource Strain: Not on file  Food Insecurity: Not on file  Transportation Needs: Not on file  Physical Activity: Not on file  Stress: Not on file  Social Connections: Not on file     Family History: The patient's family history includes Heart disease in his father and mother.  ROS:   Please see the history of present illness.    All other systems reviewed and are negative.  EKGs/Labs/Other Studies Reviewed:    The following studies were reviewed today:   EKG:  The ekg ordered today demonstrates ***  Recent Labs: No results found for requested labs within last 365 days.  Recent Lipid Panel No results found for: "CHOL", "TRIG", "HDL", "CHOLHDL", "VLDL", "LDLCALC", "LDLDIRECT"  Physical Exam:    VS:  There were no vitals taken for this visit.    Wt Readings from Last 3 Encounters:  12/15/20 192 lb (87.1 kg)  10/15/20 191 lb 3.2 oz (86.7 kg)  09/15/20 183 lb (83 kg)     GEN: *** Well nourished, well developed in no acute distress HEENT: Normal NECK: No JVD; No carotid bruits LYMPHATICS: No lymphadenopathy CARDIAC: ***RRR, no murmurs, rubs, gallops RESPIRATORY:  Clear to auscultation without rales, wheezing or rhonchi  ABDOMEN: Soft, non-tender, non-distended MUSCULOSKELETAL:  No edema; No deformity  SKIN: Warm and dry NEUROLOGIC:  Alert and oriented x 3 PSYCHIATRIC:  Normal affect        ASSESSMENT:    1. Paroxysmal atrial fibrillation (HCC)   2. Atrial flutter, unspecified type [facility])    PLAN:    In order of  problems listed above:   #Paroxysmal atrial fibrillation and flutter Doing well after his ablation in June 2022. Takes Eliquis for stroke prophylaxis.       Total time spent with patient today *** minutes. This includes reviewing records, evaluating the patient and coordinating care.   Medication Adjustments/Labs and Tests Ordered: Current medicines are reviewed at length with the patient today.  Concerns regarding medicines are outlined above.  No orders of the defined types were placed in this encounter.  No orders of the defined types were placed in this encounter.    Signed, Lars Mage, MD, [facility], [facility] 12/26/2021 9:07 PM    Electrophysiology [facility]

## 2021-12-27 ENCOUNTER — Encounter: Payer: Self-pay | Admitting: Cardiology

## 2021-12-27 ENCOUNTER — Ambulatory Visit: Payer: Medicare Other | Attending: Cardiology | Admitting: Cardiology

## 2021-12-27 ENCOUNTER — Other Ambulatory Visit: Payer: Self-pay | Admitting: Cardiology

## 2021-12-27 VITALS — BP 114/66 | HR 64 | Ht 70.0 in | Wt 189.4 lb

## 2021-12-27 DIAGNOSIS — I480 Paroxysmal atrial fibrillation: Secondary | ICD-10-CM

## 2021-12-27 DIAGNOSIS — I4892 Unspecified atrial flutter: Secondary | ICD-10-CM

## 2021-12-27 NOTE — Progress Notes (Signed)
Electrophysiology Office Follow up Visit Note:    Date:  12/27/2021   ID:  Tupac Daily Crate, DOB 01-Nov-1945, MRN [id number]  PCP:  Kathyrn Lass, MD  [facility] Cardiologist:  None  [facility] Electrophysiologist:  Vickie Epley, MD    Interval History:    [place] Demitrios Molyneux is a 76 y.o. male who presents for a follow up visit. They were last seen in clinic December 15, 2020. He underwent a successful atrial fibrillation and flutter ablation on Sep 15, 2020.  He was doing well after the procedure without any recurrence of his arrhythmia.  During his previous ablation, the veins and CTI were ablated.  Today, he presents a log of blood pressures and heart rates which is personally reviewed. Lately he denies being symptomatic and states that his heart rhythm has been stable.   He remains compliant with his medications. For a couple months he had been taking metoprolol twice by accident, but this has been fixed and he felt no difference.  For activity he enjoys playing golf. He also walks 4-5 times a week. No anginal symptoms.  Generally his diet is healthy aside from too many sweets at times. His A1c usually ranges 6.9 to 7.3.  He denies any palpitations, chest pain, shortness of breath, or peripheral edema. No lightheadedness, headaches, syncope, orthopnea, or PND.      Past Medical History:  Diagnosis Date   BPH (benign prostatic hyperplasia)    Diabetes mellitus without complication ([place])    ED (erectile dysfunction)    GERD (gastroesophageal reflux disease)    Hyperlipidemia    Hypertension     Past Surgical History:  Procedure Laterality Date   ATRIAL FIBRILLATION ABLATION N/A 09/15/2020   Procedure: ATRIAL FIBRILLATION ABLATION;  Surgeon: Vickie Epley, MD;  Location: [place];  Service: Cardiovascular;  Laterality: N/A;   basal cell cancer on face  1997   CARDIOVERSION N/A 05/14/2020   Procedure: CARDIOVERSION;  Surgeon: Lelon Perla,  MD;  Location: [facility] ENDOSCOPY;  Service: Cardiovascular;  Laterality: N/A;   TEE WITHOUT CARDIOVERSION N/A 05/14/2020   Procedure: TRANSESOPHAGEAL ECHOCARDIOGRAM (TEE);  Surgeon: Lelon Perla, MD;  Location: [facility] ENDOSCOPY;  Service: Cardiovascular;  Laterality: N/A;    Current Medications: Current Meds  Medication Sig   aspirin-sod bicarb-citric acid (ALKA-SELTZER) 325 MG TBEF tablet Take 325 mg by mouth at bedtime.   atorvastatin (LIPITOR) 10 MG tablet Take 10 mg by mouth at bedtime.   ELIQUIS 5 MG TABS tablet TAKE 1 TABLET BY MOUTH TWICE A DAY   fluticasone (FLONASE) 50 MCG/ACT nasal spray Place 1 spray into both nostrils 2 (two) times daily.   GLUCOSAMINE-CHONDROITIN PO Take 2 tablets by mouth daily.   ibuprofen (ADVIL) 200 MG tablet Take 200 mg by mouth every 6 (six) hours as needed for mild pain or moderate pain.   JARDIANCE 10 MG TABS tablet Take 10 mg by mouth daily.   Lancets (ONETOUCH ULTRASOFT) lancets    metFORMIN (GLUCOPHAGE) 500 MG tablet Take 1,000 mg by mouth 2 (two) times daily.   metoprolol succinate (TOPROL XL) 25 MG 24 hr tablet Take 1 tablet (25 mg total) by mouth daily.   montelukast (SINGULAIR) 10 MG tablet Take 10 mg by mouth at bedtime.   omeprazole (PRILOSEC) 20 MG capsule Take 20 mg by mouth daily with supper.   sildenafil (VIAGRA) 100 MG tablet Take 50 mg by mouth as needed for erectile dysfunction.     Allergies:  Catering manager   Social History   Socioeconomic History   Marital status: Married    Spouse name: Not on file   Number of children: 3   Years of education: Not on file   Highest education level: Not on file  Occupational History   Occupation: Arts administrator  Tobacco Use   Smoking status: Never   Smokeless tobacco: Never  Substance and Sexual Activity   Alcohol use: Yes    Alcohol/week: 1.0 - 2.0 standard drink of alcohol    Types: 1 - 2 Glasses of wine per week    Comment: 3-4 glasses wine / week   Drug use: Never   Sexual activity:  Not on file  Other Topics Concern   Not on file  Social History Narrative   Not on file   Social Determinants of Health   Financial Resource Strain: Not on file  Food Insecurity: Not on file  Transportation Needs: Not on file  Physical Activity: Not on file  Stress: Not on file  Social Connections: Not on file     Family History: The patient's family history includes Heart disease in his father and mother.  ROS:   Please see the history of present illness.    All other systems reviewed and are negative.  EKGs/Labs/Other Studies Reviewed:    The following studies were reviewed today:  09/15/2020  Atrial Fibrillation Ablation: CONCLUSIONS: 1. Successful PVI 2. Successful ablation of the cavotricuspid isthmus for typical atrial flutter 3. Intracardiac echo reveals trivial pericardial effusion, normal LV function and normal LA architecture 4. No early apparent complications.  08/03/2020  Cardiac CTA: IMPRESSION: 1. There is normal pulmonary vein drainage into the left atrium.   2. The left atrial appendage is large - broccoli type with two lobes and ostial size 20 mm x 17 mm and length 24 mm. There is no thrombus in the left atrial appendage.   3. The esophagus runs in the left atrial midline and is not in the proximity to any of the pulmonary veins. Esophageal thickening: 4.7 mm.   4. Coronary calcium score of 0. This was 32nd percentile for age, sex, and race matched control.   5.  Aortic atherosclerosis noted.   6. Pulmonary Artery: Dilated 34 mm. This can be seen in pulmonary hypertension.   7.  Aortic Valve calcification with calcium score of 97.  05/2020  Monitor: Enrollment 05/15/2020-05/29/2020 (14 days). Patient had a min HR of 42 bpm, max HR of 182 bpm, and avg HR of 80 bpm. 42 Supraventricular Tachycardia runs occurred, the run with the fastest interval lasting 4 beats with a max rate of 182 bpm, the longest lasting 17 beats with an avg rate of 123 bpm.  Atrial Fibrillation/Flutter occurred (20% burden), ranging from 69-174 bpm (avg of 141 bpm), the longest lasting 2 days 14 hours with an avg rate of 142 bpm. Atrial Fibrillation/Flutter was present at de-activation of device. Atrial Fibrillation/Flutter was detected within +/- 45 seconds of symptomatic patient event(s). Isolated SVEs were rare (<1.0%), SVE Couplets were rare (<1.0%), and SVE Triplets were rare (<1.0%). Isolated VEs were rare (<1.0%), and no VE Couplets or VE Triplets were present. Ventricular Trigeminy was present. Diary summarized below:   05/24/20 10:00pm fluttering/racing associated with atrial flutter 138 bpm.    Impression: 1. Episodes of SVT appear to possibly represent atrial fibrillation.  2. Atrial flutter detected.  3. 20% AF/AFL burden.  4. Rare ectopy.   05/26/2020  Echo:  1. Left ventricular  ejection fraction, by estimation, is 60 to 65%. The  left ventricle has normal function. The left ventricle has no regional  wall motion abnormalities. There is mild left ventricular hypertrophy.  Left ventricular diastolic parameters  are consistent with Grade I diastolic dysfunction (impaired relaxation).   2. Right ventricular systolic function is normal. The right ventricular  size is normal.   3. The mitral valve is grossly normal. Trivial mitral valve  regurgitation.   4. The aortic valve is tricuspid. Aortic valve regurgitation is not  visualized. Mild aortic valve sclerosis is present, with no evidence of  aortic valve stenosis.   5. The inferior vena cava is normal in size with greater than 50%  respiratory variability, suggesting right atrial pressure of 3 mmHg.   Comparison(s): Changes from prior study are noted. 05/14/2020: LVEF 40-45%, mild MR.    EKG:  EKG is personally reviewed.  12/27/2021: Sinus rhythm.  PVC.  Right bundle branch block.  Left anterior fascicular block.  Recent Labs: No results found for requested labs within last 365 days.   Recent  Lipid Panel No results found for: "CHOL", "TRIG", "HDL", "CHOLHDL", "VLDL", "LDLCALC", "LDLDIRECT"  Physical Exam:    VS:  BP 114/66   Pulse 64   Ht '5\' 10"'$  (1.778 m)   Wt 189 lb 6.4 oz (85.9 kg)   SpO2 98%   BMI 27.18 kg/m     Wt Readings from Last 3 Encounters:  12/27/21 189 lb 6.4 oz (85.9 kg)  12/15/20 192 lb (87.1 kg)  10/15/20 191 lb 3.2 oz (86.7 kg)     GEN: Well nourished, well developed in no acute distress HEENT: Normal NECK: No JVD; No carotid bruits LYMPHATICS: No lymphadenopathy CARDIAC: RRR, no murmurs, rubs, gallops RESPIRATORY:  Clear to auscultation without rales, wheezing or rhonchi  ABDOMEN: Soft, non-tender, non-distended MUSCULOSKELETAL:  No edema; No deformity  SKIN: Warm and dry NEUROLOGIC:  Alert and oriented x 3 PSYCHIATRIC:  Normal affect        ASSESSMENT:    1. Paroxysmal atrial fibrillation (HCC)   2. Atrial flutter, unspecified type [facility])    PLAN:    In order of problems listed above:  #Paroxysmal atrial fibrillation and flutter Doing well after his ablation in June 2022. Takes Eliquis for stroke prophylaxis.   Follow-up  PRN.  Total time spent with patient today 20 minutes. This includes reviewing records, evaluating the patient and coordinating care.   Medication Adjustments/Labs and Tests Ordered: Current medicines are reviewed at length with the patient today.  Concerns regarding medicines are outlined above.  No orders of the defined types were placed in this encounter.  No orders of the defined types were placed in this encounter.   I,Mathew Stumpf,acting as a Education administrator for Vickie Epley, MD.,have documented all relevant documentation on the behalf of Vickie Epley, MD,as directed by  Vickie Epley, MD while in the presence of Vickie Epley, MD.  I, Vickie Epley, MD, have reviewed all documentation for this visit. The documentation on 12/27/21 for the exam, diagnosis, procedures, and orders are all  accurate and complete.   Signed, Lars Mage, MD, [facility], [facility] 12/27/2021 1:45 PM    Electrophysiology [facility]

## 2021-12-27 NOTE — Telephone Encounter (Signed)
Prescription refill request for Eliquis received. Indication:Aflutter Last office visit:9/23 Scr:0.8 Age: 76 Weight:85.9 kg  Prescription refilled

## 2022-03-09 DIAGNOSIS — Z0000 Encounter for general adult medical examination without abnormal findings: Secondary | ICD-10-CM | POA: Diagnosis not present

## 2022-03-15 DIAGNOSIS — I700 Atherosclerosis of aorta: Secondary | ICD-10-CM | POA: Diagnosis not present

## 2022-03-15 DIAGNOSIS — I10 Essential (primary) hypertension: Secondary | ICD-10-CM | POA: Diagnosis not present

## 2022-03-15 DIAGNOSIS — E1140 Type 2 diabetes mellitus with diabetic neuropathy, unspecified: Secondary | ICD-10-CM | POA: Diagnosis not present

## 2022-03-15 DIAGNOSIS — I480 Paroxysmal atrial fibrillation: Secondary | ICD-10-CM | POA: Diagnosis not present

## 2022-03-15 DIAGNOSIS — E781 Pure hyperglyceridemia: Secondary | ICD-10-CM | POA: Diagnosis not present

## 2022-03-15 DIAGNOSIS — D6869 Other thrombophilia: Secondary | ICD-10-CM | POA: Diagnosis not present

## 2022-03-15 DIAGNOSIS — R6889 Other general symptoms and signs: Secondary | ICD-10-CM | POA: Diagnosis not present

## 2022-03-15 DIAGNOSIS — J309 Allergic rhinitis, unspecified: Secondary | ICD-10-CM | POA: Diagnosis not present

## 2022-03-22 NOTE — Progress Notes (Signed)
Cardiology Office Note:   Date:  03/25/2022  NAME:  Wesley Matthews    MRN: [id number] DOB:  [birth year]   PCP:  Kathyrn Lass, MD  Cardiologist:  None  Electrophysiologist:  Vickie Epley, MD   Referring MD: Kathyrn Lass, MD   Chief Complaint  Patient presents with   Follow-up        History of Present Illness:   Wesley Matthews is a 76 y.o. male with a hx of atrial fibrillation/flutter status post ablation who presents for follow-up.  He reports he is doing well.  No recurrence of atrial fibrillation or flutter.  Denies chest pain or trouble breathing.  He is playing golf several days per week.  A1c is up.  He is working on this.  LDL cholesterol at goal.  Denies chest pain or trouble breathing.  Overall doing well.  No bleeding on Eliquis.  Problem List 1. Diabetes -A1c 7.4 2. Hypertension 3. HLD -Total cholesterol 148, HDL 65, LDL 59, TG 149 4. Atrial Flutter/fibrillation  -typical 05/12/2020 -CHADSVASC= 3 (age, DM, HTN) -TEE/DCCV 05/14/2020 -Recurrence 05/25/2020 -Afib/flutter ablation 09/15/2020 5. 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   atorvastatin (LIPITOR) 10 MG tablet Take 10 mg by mouth at bedtime.   Blood Glucose Monitoring Suppl (ONE TOUCH ULTRA 2) w/Device KIT daily.   ELIQUIS 5 MG TABS tablet TAKE 1 TABLET BY MOUTH TWICE A DAY   fluticasone (FLONASE) 50 MCG/ACT nasal spray Place 1 spray into both nostrils 2 (two) times daily.   GLUCOSAMINE-CHONDROITIN PO Take 2 tablets by mouth daily.   ibuprofen (ADVIL) 200 MG tablet Take 200 mg by mouth every 6 (six) hours as needed for mild pain or moderate pain.   JARDIANCE 10 MG TABS tablet Take 10 mg by mouth daily.   Lancets (ONETOUCH ULTRASOFT) lancets    metFORMIN (GLUCOPHAGE) 500 MG tablet Take 1,000 mg by mouth 2 (two) times daily.   metoprolol succinate (TOPROL-XL) 50 MG 24 hr tablet Take 50 mg by mouth daily.   montelukast (SINGULAIR) 10 MG tablet Take 10 mg by mouth at bedtime.   omeprazole (PRILOSEC) 20 MG capsule Take 20 mg by mouth daily with supper.     Allergies:    Catering manager   Social History: Social History   Socioeconomic History   Marital status: Married    Spouse name: Not on file   Number of children: 3   Years of education: Not  on file   Highest education level: Not on file  Occupational History   Occupation: Arts administrator  Tobacco Use   Smoking status: Never   Smokeless tobacco: Never  Substance and Sexual Activity   Alcohol use: Yes    Alcohol/week: 1.0 - 2.0 standard drink of alcohol    Types: 1 - 2 Glasses of wine per week    Comment: 3-4 glasses wine / week   Drug use: Never   Sexual activity: Not on file  Other Topics Concern   Not on file  Social History Narrative   Not on file   Social Determinants of Health   Financial Resource Strain: Not on file  Food Insecurity: Not on  file  Transportation Needs: Not on file  Physical Activity: Not on file  Stress: Not on file  Social Connections: Not on file     Family History: The patient's family history includes Heart disease in his father and mother.  ROS:   All other ROS reviewed and negative. Pertinent positives noted in the HPI.     EKGs/Labs/Other Studies Reviewed:   The following studies were personally reviewed by me today:  TTE 05/27/2020  1. Left ventricular ejection fraction, by estimation, is 60 to 65%. The  left ventricle has normal function. The left ventricle has no regional  wall motion abnormalities. There is mild left ventricular hypertrophy.  Left ventricular diastolic parameters  are consistent with Grade I diastolic dysfunction (impaired relaxation).   2. Right ventricular systolic function is normal. The right ventricular  size is normal.   3. The mitral valve is grossly normal. Trivial mitral valve  regurgitation.   4. The aortic valve is tricuspid. Aortic valve regurgitation is not  visualized. Mild aortic valve sclerosis is present, with no evidence of  aortic valve stenosis.   5. The inferior vena cava is normal in size with greater than 50%  respiratory variability, suggesting right atrial pressure of 3 mmHg.   Recent Labs: No results found for requested labs within last 365 days.   Recent Lipid Panel No results found for: "CHOL", "TRIG", "HDL", "CHOLHDL", "VLDL", "LDLCALC", "LDLDIRECT"  Physical Exam:   VS:  BP 124/72 (BP Location: Left Arm, Patient Position: Sitting, Cuff Size: Normal)   Pulse 96   Ht _0  (1.778 m)   Wt 194 lb (88 kg)   SpO2 98%   BMI 27.84 kg/m    Wt Readings from Last 3 Encounters:  03/25/22 194 lb (88 kg)  12/27/21 189 lb 6.4 oz (85.9 kg)  12/15/20 192 lb (87.1 kg)    General: Well nourished, well developed, in no acute distress Head: Atraumatic, normal size  Eyes: PEERLA, EOMI  Neck: Supple, no JVD Endocrine: No thryomegaly Cardiac: Normal  S1, S2; RRR; no murmurs, rubs, or gallops Lungs: Clear to auscultation bilaterally, no wheezing, rhonchi or rales  Abd: Soft, nontender, no hepatomegaly  Ext: No edema, pulses 2+ Musculoskeletal: No deformities, BUE and BLE strength normal and equal Skin: Warm and dry, no rashes   Neuro: Alert and oriented to person, place, time, and situation, CNII-XII grossly intact, no focal deficits  Psych: Normal mood and affect   ASSESSMENT:   Wesley Matthews is a 76 y.o. male who presents for the following: 1. Paroxysmal atrial fibrillation (HCC)   2. Atrial flutter, unspecified type ([place])   3. Agatston coronary artery calcium score less than 100   4. Mixed hyperlipidemia   5. Primary hypertension     PLAN:  1. Paroxysmal atrial fibrillation ([place]) 2. Atrial flutter, unspecified type ([place]) -Status post atrial fibrillation and flutter ablation.  Doing well.  Continue Eliquis 5 mg twice daily.  We discussed cardia mobile to monitor his heart rhythm.  He is on metoprolol 50 mg daily and doing well on this.  Echo normal.  No symptoms of angina.  He will see [place] yearly.  3. Agatston coronary artery calcium score less than 100 4. Mixed hyperlipidemia -He is diabetic.  His A1c is 7.4.  LDL 59.  Continue Lipitor 10 mg daily.  5. Primary hypertension -Well-controlled on metoprolol.  He will see [place] yearly.     Disposition: Return in about 1 year (around 03/26/2023).  Medication Adjustments/Labs and Tests Ordered: Current medicines are reviewed at length with the patient today.  Concerns regarding medicines are outlined above.  No orders of the defined types were placed in this encounter.  No orders of the defined types were placed in this encounter.   Patient Instructions  Medication Instructions:  The current medical regimen is effective;  continue present plan and medications.  *If you need a refill on your cardiac medications before your next appointment, please call your  pharmacy*   Follow-Up: At [facility], you and your health needs are our priority.  As part of our continuing mission to provide you with exceptional heart care, we have created designated Provider Care Teams.  These Care Teams include your primary Cardiologist (physician) and Advanced Practice Providers (APPs -  Physician Assistants and Nurse Practitioners) who all work together to provide you with the care you need, when you need it.  We recommend signing up for the patient portal called "MyChart".  Sign up information is provided on this After Visit Summary.  MyChart is used to connect with patients for Virtual Visits (Telemedicine).  Patients are able to view lab/test results, encounter notes, upcoming appointments, etc.  Non-urgent messages can be sent to your provider as well.   To learn more about what you can do with MyChart, go to [email or web address].    Your next appointment:   12 month(s)  The format for your next appointment:   In Person  Provider:   Eleonore Chiquito, MD   [place]        Time Spent with Patient: I have spent a total of 25 minutes with patient reviewing hospital notes, telemetry, EKGs, labs and examining the patient as well as establishing an assessment and plan that was discussed with the patient.  > 50% of time was spent in direct patient care.  Signed, Addison Naegeli. Audie Box, MD, [place] [facility]  [address], [place], [place] [address] [phone]  03/25/2022 3:52 PM

## 2022-03-25 ENCOUNTER — Ambulatory Visit: Payer: Medicare Other | Attending: Cardiovascular Disease | Admitting: Cardiovascular Disease

## 2022-03-25 ENCOUNTER — Encounter: Payer: Self-pay | Admitting: Cardiovascular Disease

## 2022-03-25 VITALS — BP 124/72 | HR 96 | Ht 70.0 in | Wt 194.0 lb

## 2022-03-25 DIAGNOSIS — R931 Abnormal findings on diagnostic imaging of heart and coronary circulation: Secondary | ICD-10-CM

## 2022-03-25 DIAGNOSIS — I10 Essential (primary) hypertension: Secondary | ICD-10-CM | POA: Diagnosis not present

## 2022-03-25 DIAGNOSIS — E782 Mixed hyperlipidemia: Secondary | ICD-10-CM

## 2022-03-25 DIAGNOSIS — I4892 Unspecified atrial flutter: Secondary | ICD-10-CM | POA: Diagnosis not present

## 2022-03-25 DIAGNOSIS — I480 Paroxysmal atrial fibrillation: Secondary | ICD-10-CM | POA: Diagnosis not present

## 2022-03-25 NOTE — Patient Instructions (Signed)
Medication Instructions:  The current medical regimen is effective;  continue present plan and medications.  *If you need a refill on your cardiac medications before your next appointment, please call your pharmacy*   Follow-Up: At [facility], you and your health needs are our priority.  As part of our continuing mission to provide you with exceptional heart care, we have created designated Provider Care Teams.  These Care Teams include your primary Cardiologist (physician) and Advanced Practice Providers (APPs -  Physician Assistants and Nurse Practitioners) who all work together to provide you with the care you need, when you need it.  We recommend signing up for the patient portal called "MyChart".  Sign up information is provided on this After Visit Summary.  MyChart is used to connect with patients for Virtual Visits (Telemedicine).  Patients are able to view lab/test results, encounter notes, upcoming appointments, etc.  Non-urgent messages can be sent to your provider as well.   To learn more about what you can do with MyChart, go to [email or web address].    Your next appointment:   12 month(s)  The format for your next appointment:   In Person  Provider:   Eleonore Chiquito, MD   [facility]

## 2022-04-28 DIAGNOSIS — K219 Gastro-esophageal reflux disease without esophagitis: Secondary | ICD-10-CM | POA: Diagnosis not present

## 2022-04-28 DIAGNOSIS — I480 Paroxysmal atrial fibrillation: Secondary | ICD-10-CM | POA: Diagnosis not present

## 2022-04-28 DIAGNOSIS — I10 Essential (primary) hypertension: Secondary | ICD-10-CM | POA: Diagnosis not present

## 2022-04-28 DIAGNOSIS — E781 Pure hyperglyceridemia: Secondary | ICD-10-CM | POA: Diagnosis not present

## 2022-04-28 DIAGNOSIS — E1140 Type 2 diabetes mellitus with diabetic neuropathy, unspecified: Secondary | ICD-10-CM | POA: Diagnosis not present

## 2022-05-26 ENCOUNTER — Encounter (HOSPITAL_COMMUNITY): Payer: Self-pay | Admitting: *Deleted

## 2022-05-30 DIAGNOSIS — L821 Other seborrheic keratosis: Secondary | ICD-10-CM | POA: Diagnosis not present

## 2022-05-30 DIAGNOSIS — L570 Actinic keratosis: Secondary | ICD-10-CM | POA: Diagnosis not present

## 2022-05-30 DIAGNOSIS — H61003 Unspecified perichondritis of external ear, bilateral: Secondary | ICD-10-CM | POA: Diagnosis not present

## 2022-05-30 DIAGNOSIS — Z85828 Personal history of other malignant neoplasm of skin: Secondary | ICD-10-CM | POA: Diagnosis not present

## 2022-06-05 ENCOUNTER — Other Ambulatory Visit: Payer: Self-pay | Admitting: Cardiology

## 2022-06-05 DIAGNOSIS — I4892 Unspecified atrial flutter: Secondary | ICD-10-CM

## 2022-06-05 DIAGNOSIS — I480 Paroxysmal atrial fibrillation: Secondary | ICD-10-CM

## 2022-06-06 NOTE — Telephone Encounter (Addendum)
Eliquis 43m refill request received. Patient is 77years old, weight-88kg, Crea-0.85 [place] and last seen by Dr. OMarisue Ivanon 03/25/22. Dose is appropriate based on dosing criteria. Will send in refill to requested pharmacy.

## 2022-07-22 DIAGNOSIS — H52203 Unspecified astigmatism, bilateral: Secondary | ICD-10-CM | POA: Diagnosis not present

## 2022-07-22 DIAGNOSIS — H2513 Age-related nuclear cataract, bilateral: Secondary | ICD-10-CM | POA: Diagnosis not present

## 2022-08-25 DIAGNOSIS — L570 Actinic keratosis: Secondary | ICD-10-CM | POA: Diagnosis not present

## 2022-08-25 DIAGNOSIS — L821 Other seborrheic keratosis: Secondary | ICD-10-CM | POA: Diagnosis not present

## 2022-08-25 DIAGNOSIS — D2262 Melanocytic nevi of left upper limb, including shoulder: Secondary | ICD-10-CM | POA: Diagnosis not present

## 2022-08-25 DIAGNOSIS — Z85828 Personal history of other malignant neoplasm of skin: Secondary | ICD-10-CM | POA: Diagnosis not present

## 2022-08-25 DIAGNOSIS — H61002 Unspecified perichondritis of left external ear: Secondary | ICD-10-CM | POA: Diagnosis not present

## 2022-08-25 DIAGNOSIS — D225 Melanocytic nevi of trunk: Secondary | ICD-10-CM | POA: Diagnosis not present

## 2022-09-14 DIAGNOSIS — R931 Abnormal findings on diagnostic imaging of heart and coronary circulation: Secondary | ICD-10-CM | POA: Diagnosis not present

## 2022-09-14 DIAGNOSIS — E1136 Type 2 diabetes mellitus with diabetic cataract: Secondary | ICD-10-CM | POA: Diagnosis not present

## 2022-09-14 DIAGNOSIS — I480 Paroxysmal atrial fibrillation: Secondary | ICD-10-CM | POA: Diagnosis not present

## 2022-09-14 DIAGNOSIS — Z79899 Other long term (current) drug therapy: Secondary | ICD-10-CM | POA: Diagnosis not present

## 2022-09-14 DIAGNOSIS — E1159 Type 2 diabetes mellitus with other circulatory complications: Secondary | ICD-10-CM | POA: Diagnosis not present

## 2022-09-14 DIAGNOSIS — E1169 Type 2 diabetes mellitus with other specified complication: Secondary | ICD-10-CM | POA: Diagnosis not present

## 2022-09-14 DIAGNOSIS — I10 Essential (primary) hypertension: Secondary | ICD-10-CM | POA: Diagnosis not present

## 2022-09-14 DIAGNOSIS — I4892 Unspecified atrial flutter: Secondary | ICD-10-CM | POA: Diagnosis not present

## 2022-09-14 DIAGNOSIS — I700 Atherosclerosis of aorta: Secondary | ICD-10-CM | POA: Diagnosis not present

## 2022-09-14 DIAGNOSIS — D6869 Other thrombophilia: Secondary | ICD-10-CM | POA: Diagnosis not present

## 2022-12-15 DIAGNOSIS — E1159 Type 2 diabetes mellitus with other circulatory complications: Secondary | ICD-10-CM | POA: Diagnosis not present

## 2023-01-10 ENCOUNTER — Other Ambulatory Visit: Payer: Self-pay | Admitting: Cardiovascular Disease

## 2023-01-10 ENCOUNTER — Encounter: Payer: Self-pay | Admitting: Cardiovascular Disease

## 2023-01-10 DIAGNOSIS — I4892 Unspecified atrial flutter: Secondary | ICD-10-CM

## 2023-01-10 DIAGNOSIS — I480 Paroxysmal atrial fibrillation: Secondary | ICD-10-CM

## 2023-01-11 MED ORDER — APIXABAN 5 MG PO TABS: 5.0000 mg | ORAL_TABLET | Freq: Two times a day (BID) | ORAL | 1 refills | Status: DC

## 2023-01-11 NOTE — Telephone Encounter (Signed)
Prescription refill request for Eliquis received. Indication: Afib  Last office visit: 03/25/22 (O'Neal)  Scr: 0.86  (12/15/22 K[facility])  Age: 77 Weight: 88kg  Appropriate dose. Refill sent.

## 2023-01-11 NOTE — Telephone Encounter (Signed)
Eliquis 5mg  refill request received. Patient is 77 years old, weight-88kg, Crea-0.86 on 12/15/22 via [facility] from [place], [place], and last seen by Lennie Odor on 03/25/22. Dose is appropriate based on dosing criteria. Will send in refill to requested pharmacy.

## 2023-02-16 ENCOUNTER — Encounter: Payer: Self-pay | Admitting: Cardiovascular Disease

## 2023-02-17 ENCOUNTER — Telehealth: Payer: Self-pay | Admitting: Cardiovascular Disease

## 2023-02-17 NOTE — Telephone Encounter (Signed)
Patient c/o Palpitations:  STAT if patient reporting lightheadedness, shortness of breath, or chest pain  How long have you had palpitations/irregular HR/ Afib? Are you having the symptoms now? I have observed the "irregular heartbeat" icon on 10 out of 12 BP/Pulse readings on my Omron device. All 12 of the BP/Pulse readings have been in the normal range. I am not experiencing symptoms other than occasional fluttering.   Are you currently experiencing lightheadedness, SOB or CP? No   Do you have a history of afib (atrial fibrillation) or irregular heart rhythm? Drs. O'Neal and Lalla Brothers should have a history of my afib. Dr. Flora Lipps diagnosed my afib and aflutter in early 2022; Dr. Lalla Brothers performed ablation procedures on 09/15/20. I have not noticed any "irregular heart beat" icons on my Omron until the past 3 weeks.   Have you checked your BP or HR? (document readings if available): I check my BP/Pulse generally 3 times per week, which I document. As stated in #1 above, readings over past 3 weeks have been in normal range.   Are you experiencing any other symptoms? Other than slight feelings of fluttering, particularly before I go to bed, no symptoms experienced.   Answers received via patient schedule.

## 2023-02-17 NOTE — Telephone Encounter (Signed)
Spoke to patient and appt scheduled for 11/5. Advised him if he develops symptoms or worsen he should be seen in the ED. Pt verbalized understanding and agree.

## 2023-02-17 NOTE — Telephone Encounter (Signed)
Call and spoke to patient who is having some chest flutter. He denies any other symptoms at this time. He reports he has a BP monitor that alerts him when he has irregular heart beats. 10/12 readings have been irregular. BP readings: Patient was out playing golf prior to me calling him.   10/27 132/72 -72** 10/28  123/62-56 10/30  124/65- 59**    ** indicate irregular heart  10/31  133/72--47**         beat              121/69  66**

## 2023-02-19 NOTE — Progress Notes (Unsigned)
Cardiology Clinic Note   Patient Name: Wesley Matthews Date of Encounter: 02/21/2023  Primary Care Provider:  Sigmund Hazel, MD Primary Cardiologist:  None  Patient Profile    Wesley Matthews 77 year old male presents to the clinic today for follow-up evaluation of his palpitations.  Past Medical History    Past Medical History:  Diagnosis Date   BPH (benign prostatic hyperplasia)    Diabetes mellitus without complication (HCC)    ED (erectile dysfunction)    GERD (gastroesophageal reflux disease)    Hyperlipidemia    Hypertension    Past Surgical History:  Procedure Laterality Date   ATRIAL FIBRILLATION ABLATION N/A 09/15/2020   Procedure: ATRIAL FIBRILLATION ABLATION;  Surgeon: Lanier Prude, MD;  Location: [facility] INVASIVE CV LAB;  Service: Cardiovascular;  Laterality: N/A;   basal cell cancer on face  1997   CARDIOVERSION N/A 05/14/2020   Procedure: CARDIOVERSION;  Surgeon: Lewayne Bunting, MD;  Location: [facility] ENDOSCOPY;  Service: Cardiovascular;  Laterality: N/A;   TEE WITHOUT CARDIOVERSION N/A 05/14/2020   Procedure: TRANSESOPHAGEAL ECHOCARDIOGRAM (TEE);  Surgeon: Lewayne Bunting, MD;  Location: [facility] ENDOSCOPY;  Service: Cardiovascular;  Laterality: N/A;    Allergies  Allergies  Allergen Reactions   Fire Ant Anaphylaxis    History of Present Illness    Wesley Matthews has a PMH of atrial fibrillation/flutter status post ablation 09/15/2020 by Dr. Lalla Brothers.  Echocardiogram 05/27/2020 showed an EF of 60-65%, G1 DD, trivial mitral valve regurgitation, and mild aortic valve stenosis.  His MH also includes HLD, HTN, and diabetes.  He had coronary calcium scoring which was less than 100.  He was seen in follow-up by Dr. Flora Lipps on 03/25/2022.  During that time he was doing well from a cardiac standpoint.  He continued to take Eliquis as prescribed.  Kardia mobile personal cardiac monitor was discussed.  His metoprolol was continued.  He contacted the nurse  triage line on 02/17/2023 and indicated that he was noticing chest flutters.  He denied symptoms at the time of the call.  His blood pressure monitor was alerting him of irregular heartbeats.  He started noticing alert around 01/28/2023.  He presents to the clinic today for follow-up evaluation and states he noted some irregular beats on his blood pressure machine.  His heart rate continued to be well-controlled.  He has purchased a Nutritional therapist.  He has been compliant with his apixaban and beta-blocker therapy.  We reviewed importance of not missing anticoagulation doses.  He expressed understanding.  He presented with his wife.  He continues to be very physically active golfing and doing other physical activities.  He is monitoring his A1c closely and had recent improvement from 7.5 A1c down to 7.3 A1c.  I will continue his current medication, plan follow-up in 9 to 12 months, and have him avoid triggers for A-fib..  Today he denies chest pain, shortness of breath, lower extremity edema, fatigue, melena, hematuria, hemoptysis, diaphoresis, weakness, presyncope, syncope, orthopnea,and PND.    Home Medications    Prior to Admission medications   Medication Sig Start Date End Date Taking? Authorizing Provider  apixaban (ELIQUIS) 5 MG TABS tablet Take 1 tablet (5 mg total) by mouth 2 (two) times daily. 01/11/23   O'NealRonnald Ramp, MD  aspirin-sod bicarb-citric acid (ALKA-SELTZER) 325 MG TBEF tablet Take 325 mg by mouth at bedtime.    [provider]  atorvastatin (LIPITOR) 10 MG tablet Take 10 mg by mouth at bedtime. 04/20/20  [provider]  Blood Glucose Monitoring Suppl (ONE TOUCH ULTRA 2) w/Device KIT daily. 12/17/21   [provider]  fluticasone (FLONASE) 50 MCG/ACT nasal spray Place 1 spray into both nostrils 2 (two) times daily. 03/28/20   [provider]  GLUCOSAMINE-CHONDROITIN PO Take 2 tablets by mouth daily.    [provider]   ibuprofen (ADVIL) 200 MG tablet Take 200 mg by mouth every 6 (six) hours as needed for mild pain or moderate pain.    [provider]  JARDIANCE 10 MG TABS tablet Take 10 mg by mouth daily. 05/06/20   [provider]  Lancets Letta Pate ULTRASOFT) lancets  11/15/19   [provider]  metFORMIN (GLUCOPHAGE) 500 MG tablet Take 1,000 mg by mouth 2 (two) times daily. 03/03/20   [provider]  metoprolol succinate (TOPROL XL) 25 MG 24 hr tablet Take 1 tablet (25 mg total) by mouth daily. Patient not taking: Reported on 03/25/2022 11/15/21   Lanier Prude, MD  metoprolol succinate (TOPROL-XL) 50 MG 24 hr tablet Take 50 mg by mouth daily. 03/15/22   [provider]  montelukast (SINGULAIR) 10 MG tablet Take 10 mg by mouth at bedtime.    [provider]  omeprazole (PRILOSEC) 20 MG capsule Take 20 mg by mouth daily with supper. 04/09/20   [provider]  sildenafil (VIAGRA) 100 MG tablet Take 50 mg by mouth as needed for erectile dysfunction. Patient not taking: Reported on 03/25/2022 04/21/20   [provider]    Family History    Family History  Problem Relation Age of Onset   Heart disease Mother    Heart disease Father    He indicated that his mother is deceased. He indicated that his father is deceased.  Social History    Social History   Socioeconomic History   Marital status: Married    Spouse name: Not on file   Number of children: 3   Years of education: Not on file   Highest education level: Not on file  Occupational History   Occupation: Glass blower/designer  Tobacco Use   Smoking status: Never   Smokeless tobacco: Never  Substance and Sexual Activity   Alcohol use: Yes    Alcohol/week: 1.0 - 2.0 standard drink of alcohol    Types: 1 - 2 Glasses of wine per week    Comment: 3-4 glasses wine / week   Drug use: Never   Sexual activity: Not on file  Other Topics Concern   Not on file  Social  History Narrative   Not on file   Social Determinants of Health   Financial Resource Strain: Not on file  Food Insecurity: Not on file  Transportation Needs: Not on file  Physical Activity: Not on file  Stress: Not on file  Social Connections: Not on file  Intimate Partner Violence: Not on file     Review of Systems    General:  No chills, fever, night sweats or weight changes.  Cardiovascular:  No chest pain, dyspnea on exertion, edema, orthopnea, palpitations, paroxysmal nocturnal dyspnea. Dermatological: No rash, lesions/masses Respiratory: No cough, dyspnea Urologic: No hematuria, dysuria Abdominal:   No nausea, vomiting, diarrhea, bright red blood per rectum, melena, or hematemesis Neurologic:  No visual changes, wkns, changes in mental status. All other systems reviewed and are otherwise negative except as noted above.  Physical Exam    VS:  BP (!) 118/58 (BP Location: Left Arm, Patient Position: Sitting, Cuff Size: Normal)  Pulse (!) 56   Ht 5\' 10"  (1.778 m)   Wt 191 lb (86.6 kg)   SpO2 99%   BMI 27.41 kg/m  , BMI Body mass index is 27.41 kg/m. GEN: Well nourished, well developed, in no acute distress. HEENT: normal. Neck: Supple, no JVD, carotid bruits, or masses. Cardiac: RRR, no murmurs, rubs, or gallops. No clubbing, cyanosis, edema.  Radials/DP/PT 2+ and equal bilaterally.  Respiratory:  Respirations regular and unlabored, clear to auscultation bilaterally. GI: Soft, nontender, nondistended, BS + x 4. MS: no deformity or atrophy. Skin: warm and dry, no rash. Neuro:  Strength and sensation are intact. Psych: Normal affect.  Accessory Clinical Findings    Recent Labs: No results found for requested labs within last 365 days.   Recent Lipid Panel No results found for: "CHOL", "TRIG", "HDL", "CHOLHDL", "VLDL", "LDLCALC", "LDLDIRECT"       ECG personally reviewed by me today- EKG Interpretation Date/Time:  Tuesday February 21 2023 09:01:55  EST Ventricular Rate:  51 PR Interval:  190 QRS Duration:  158 QT Interval:  448 QTC Calculation: 412 R Axis:   -54  Text Interpretation: Sinus bradycardia Left axis deviation Right bundle branch block When compared with ECG of 15-Oct-2020 08:36, Premature atrial complexes are no longer Present Nonspecific T wave abnormality now evident in Inferior leads Confirmed by Edd Fabian [phone]) on 02/21/2023 9:03:55 AM   Echocardiogram 05/27/2020  1. Left ventricular ejection fraction, by estimation, is 60 to 65%. The  left ventricle has normal function. The left ventricle has no regional  wall motion abnormalities. There is mild left ventricular hypertrophy.  Left ventricular diastolic parameters  are consistent with Grade I diastolic dysfunction (impaired relaxation).   2. Right ventricular systolic function is normal. The right ventricular  size is normal.   3. The mitral valve is grossly normal. Trivial mitral valve  regurgitation.   4. The aortic valve is tricuspid. Aortic valve regurgitation is not  visualized. Mild aortic valve sclerosis is present, with no evidence of  aortic valve stenosis.   5. The inferior vena cava is normal in size with greater than 50%  respiratory variability, suggesting right atrial pressure of 3 mmHg.       Assessment & Plan   1.  Paroxysmal atrial fibrillation, atrial flutter-has been noticing palpitations and an alert on his blood pressure monitor since October.  EKG today shows sinus bradycardia left axis deviation right bundle branch block 51 bpm.  Reports compliance with apixaban.  Denies bleeding issues. Obtain Kardia mobile device Avoid triggers caffeine, chocolate, EtOH, dehydration etc. Recent lab work with PCP unremarkable  Coronary artery disease-Agatston score on coronary calcium score less than 100. Continue atorvastatin Heart healthy low-sodium high-fiber diet Increase physical activity as tolerated  Hyperlipidemia-LDL  5911/22. High-fiber diet Continue atorvastatin Follows with ACP  Essential hypertension-BP today 118/58. Maintain blood pressure log Continue metoprolol Heart healthy low-sodium diet  Disposition: Follow-up with Dr. Flora Lipps or me after cardiac event monitor   Thomasene Ripple. Yuuki Skeens NP-C     02/21/2023, 9:04 AM [facility] [address] Office [phone] Fax [phone]    I spent 15 minutes examining this patient, reviewing medications, and using patient centered shared decision making involving her cardiac care.   I spent greater than 20 minutes reviewing her past medical history,  medications, and prior cardiac tests.

## 2023-02-21 ENCOUNTER — Ambulatory Visit: Payer: Medicare Other | Attending: General Practice | Admitting: General Practice

## 2023-02-21 ENCOUNTER — Encounter: Payer: Self-pay | Admitting: General Practice

## 2023-02-21 VITALS — BP 118/58 | HR 56 | Ht 70.0 in | Wt 191.0 lb

## 2023-02-21 DIAGNOSIS — I10 Essential (primary) hypertension: Secondary | ICD-10-CM

## 2023-02-21 DIAGNOSIS — E782 Mixed hyperlipidemia: Secondary | ICD-10-CM

## 2023-02-21 DIAGNOSIS — I480 Paroxysmal atrial fibrillation: Secondary | ICD-10-CM | POA: Diagnosis not present

## 2023-02-21 DIAGNOSIS — I4892 Unspecified atrial flutter: Secondary | ICD-10-CM | POA: Diagnosis not present

## 2023-02-21 NOTE — Patient Instructions (Addendum)
Medication Instructions:  The current medical regimen is effective;  continue present plan and medications as directed. Please refer to the Current Medication list given to you today. *If you need a refill on your cardiac medications before your next appointment, please call your pharmacy*  Lab Work: NONE  Other Instructions MAINTAIN YOUR PHYSICAL ACTIVITY  Please try to avoid these triggers: Do not use any products that have nicotine or tobacco in them. These include cigarettes, e-cigarettes, and chewing tobacco. If you need help quitting, ask your doctor. Eat heart-healthy foods. Talk with your doctor about the right eating plan for you. Exercise regularly as told by your doctor. Stay hydrated Do not drink alcohol, Caffeine or chocolate. Lose weight if you are overweight. Do not use drugs, including cannabis  Follow-Up: At [facility], you and your health needs are our priority.  As part of our continuing mission to provide you with exceptional heart care, we have created designated Provider Care Teams.  These Care Teams include your primary Cardiologist (physician) and Advanced Practice Providers (APPs -  Physician Assistants and Nurse Practitioners) who all work together to provide you with the care you need, when you need it.  Your next appointment:   9-12 month(s)  Provider:   Dr Flora Lipps

## 2023-03-13 DIAGNOSIS — Z0000 Encounter for general adult medical examination without abnormal findings: Secondary | ICD-10-CM | POA: Diagnosis not present

## 2023-03-21 DIAGNOSIS — I10 Essential (primary) hypertension: Secondary | ICD-10-CM | POA: Diagnosis not present

## 2023-03-21 DIAGNOSIS — E119 Type 2 diabetes mellitus without complications: Secondary | ICD-10-CM | POA: Diagnosis not present

## 2023-03-21 DIAGNOSIS — E1159 Type 2 diabetes mellitus with other circulatory complications: Secondary | ICD-10-CM | POA: Diagnosis not present

## 2023-03-21 DIAGNOSIS — R931 Abnormal findings on diagnostic imaging of heart and coronary circulation: Secondary | ICD-10-CM | POA: Diagnosis not present

## 2023-03-21 DIAGNOSIS — M7711 Lateral epicondylitis, right elbow: Secondary | ICD-10-CM | POA: Diagnosis not present

## 2023-03-21 DIAGNOSIS — M19049 Primary osteoarthritis, unspecified hand: Secondary | ICD-10-CM | POA: Diagnosis not present

## 2023-03-21 DIAGNOSIS — Z136 Encounter for screening for cardiovascular disorders: Secondary | ICD-10-CM | POA: Diagnosis not present

## 2023-03-21 DIAGNOSIS — I480 Paroxysmal atrial fibrillation: Secondary | ICD-10-CM | POA: Diagnosis not present

## 2023-03-27 ENCOUNTER — Ambulatory Visit: Payer: Medicare Other | Admitting: Cardiovascular Disease

## 2023-07-24 DIAGNOSIS — E119 Type 2 diabetes mellitus without complications: Secondary | ICD-10-CM | POA: Diagnosis not present

## 2023-07-24 DIAGNOSIS — H524 Presbyopia: Secondary | ICD-10-CM | POA: Diagnosis not present

## 2023-07-24 DIAGNOSIS — H2513 Age-related nuclear cataract, bilateral: Secondary | ICD-10-CM | POA: Diagnosis not present

## 2023-07-24 DIAGNOSIS — H5203 Hypermetropia, bilateral: Secondary | ICD-10-CM | POA: Diagnosis not present

## 2023-08-07 DIAGNOSIS — L821 Other seborrheic keratosis: Secondary | ICD-10-CM | POA: Diagnosis not present

## 2023-08-07 DIAGNOSIS — H61003 Unspecified perichondritis of external ear, bilateral: Secondary | ICD-10-CM | POA: Diagnosis not present

## 2023-08-07 DIAGNOSIS — L304 Erythema intertrigo: Secondary | ICD-10-CM | POA: Diagnosis not present

## 2023-08-07 DIAGNOSIS — Z85828 Personal history of other malignant neoplasm of skin: Secondary | ICD-10-CM | POA: Diagnosis not present

## 2023-08-07 DIAGNOSIS — L905 Scar conditions and fibrosis of skin: Secondary | ICD-10-CM | POA: Diagnosis not present

## 2023-08-07 DIAGNOSIS — L570 Actinic keratosis: Secondary | ICD-10-CM | POA: Diagnosis not present

## 2023-08-07 DIAGNOSIS — L820 Inflamed seborrheic keratosis: Secondary | ICD-10-CM | POA: Diagnosis not present

## 2023-11-06 DIAGNOSIS — E1159 Type 2 diabetes mellitus with other circulatory complications: Secondary | ICD-10-CM | POA: Diagnosis not present

## 2023-11-06 DIAGNOSIS — I480 Paroxysmal atrial fibrillation: Secondary | ICD-10-CM | POA: Diagnosis not present

## 2023-11-06 DIAGNOSIS — J309 Allergic rhinitis, unspecified: Secondary | ICD-10-CM | POA: Diagnosis not present

## 2023-11-06 DIAGNOSIS — R931 Abnormal findings on diagnostic imaging of heart and coronary circulation: Secondary | ICD-10-CM | POA: Diagnosis not present

## 2023-11-06 DIAGNOSIS — K219 Gastro-esophageal reflux disease without esophagitis: Secondary | ICD-10-CM | POA: Diagnosis not present

## 2023-11-10 DIAGNOSIS — H00015 Hordeolum externum left lower eyelid: Secondary | ICD-10-CM | POA: Diagnosis not present

## 2023-11-10 DIAGNOSIS — H10502 Unspecified blepharoconjunctivitis, left eye: Secondary | ICD-10-CM | POA: Diagnosis not present

## 2023-12-23 ENCOUNTER — Other Ambulatory Visit: Payer: Self-pay | Admitting: Cardiovascular Disease

## 2023-12-23 DIAGNOSIS — I480 Paroxysmal atrial fibrillation: Secondary | ICD-10-CM

## 2023-12-23 DIAGNOSIS — I4892 Unspecified atrial flutter: Secondary | ICD-10-CM

## 2023-12-25 NOTE — Telephone Encounter (Signed)
 Prescription refill request for Eliquis  received. Indication: PAF Last office visit: 02/21/23  Wesley Beauvais NP Scr: 0.91 on 09/15/20  Epic Age: 78 Weight: 86.6kg  Based on above findings Eliquis  5mg  twice daily is the appropriate dose.  Pt is past due for appt with MD and labs.  Message sent to schedulers. Refill approved x 1.

## 2023-12-27 DIAGNOSIS — Z23 Encounter for immunization: Secondary | ICD-10-CM | POA: Diagnosis not present

## 2023-12-28 ENCOUNTER — Encounter: Payer: Self-pay | Admitting: Cardiovascular Disease

## 2024-01-01 DIAGNOSIS — M4316 Spondylolisthesis, lumbar region: Secondary | ICD-10-CM | POA: Diagnosis not present

## 2024-01-01 DIAGNOSIS — M47816 Spondylosis without myelopathy or radiculopathy, lumbar region: Secondary | ICD-10-CM | POA: Diagnosis not present

## 2024-01-29 DIAGNOSIS — M545 Low back pain, unspecified: Secondary | ICD-10-CM | POA: Diagnosis not present

## 2024-02-12 DIAGNOSIS — E1140 Type 2 diabetes mellitus with diabetic neuropathy, unspecified: Secondary | ICD-10-CM | POA: Diagnosis not present

## 2024-02-12 DIAGNOSIS — M545 Low back pain, unspecified: Secondary | ICD-10-CM | POA: Diagnosis not present

## 2024-02-12 LAB — LAB REPORT - SCANNED: EGFR: 89

## 2024-02-26 NOTE — Progress Notes (Unsigned)
 Cardiology Office Note:  .   Date:  02/27/2024  ID:  Wesley Matthews, DOB 04-08-1946, MRN [id number] PCP: Cleotilde Planas, MD  [facility] Providers Cardiologist:  None { History of Present Illness: .    Chief Complaint  Patient presents with   Follow-up         Wesley Matthews is a 78 y.o. male with history of pAF who presents for follow-up.    History of Present Illness   Wesley Matthews is a 78 year old male with paroxysmal atrial fibrillation and flutter, status post ablation, who presents for follow-up.  He underwent an ablation for atrial fibrillation in 2022. Since the procedure, he reports feeling well and has not noticed any symptoms suggestive of recurrence of atrial fibrillation or flutter.  He remains active, playing golf three to five times a week, and has no symptoms such as chest pain or trouble breathing.  He is currently taking Eliquis  5 mg twice daily and metoprolol  succinate 50 mg daily. He has not experienced any bruising or bleeding while on Eliquis . He inquired about the possibility of discontinuing Eliquis , as he has not had any recurrence of atrial fibrillation.  He has a history of diabetes with a recent A1c of 7.2, down from 7.7, managed with metformin, Jardiance, and Rybelsus 3 mg daily. He could not tolerate a higher dose of Rybelsus due to stomach issues. His coronary calcium score is 81, placing him in the 32nd percentile, and his most recent lipid profile shows an LDL of 66 on Lipitor 10 mg daily.  He reports slight swelling in his right ankle, which he attributes to an old knee injury from playing football. His right leg is slightly larger than his left due to favoring it over the years. The swelling is more noticeable in the evening and subsides by morning.          Problem List 1. Diabetes -A1c 7.2 2. Hypertension 3. HLD -T chol 157, HDL 59, LDL 66, TG 189 4. Atrial Flutter/fibrillation  -PVI 09/16/2020 5. CAC -CAC 81  (32nd percentile) 6. RBBB/LAFB    ROS: All other ROS reviewed and negative. Pertinent positives noted in the HPI.     Studies Reviewed: SABRA   EKG Interpretation Date/Time:  Tuesday February 27 2024 08:49:09 EST Ventricular Rate:  58 PR Interval:  204 QRS Duration:  142 QT Interval:  428 QTC Calculation: 420 R Axis:   -47  Text Interpretation: Sinus bradycardia Right bundle branch block Left anterior fascicular block Bifascicular block Confirmed by Barbaraann Kotyk [phone]) on 02/27/2024 8:50:58 AM   TTE 05/27/2020  1. Left ventricular ejection fraction, by estimation, is 60 to 65%. The  left ventricle has normal function. The left ventricle has no regional  wall motion abnormalities. There is mild left ventricular hypertrophy.  Left ventricular diastolic parameters  are consistent with Grade I diastolic dysfunction (impaired relaxation).   2. Right ventricular systolic function is normal. The right ventricular  size is normal.   3. The mitral valve is grossly normal. Trivial mitral valve  regurgitation.   4. The aortic valve is tricuspid. Aortic valve regurgitation is not  visualized. Mild aortic valve sclerosis is present, with no evidence of  aortic valve stenosis.   5. The inferior vena cava is normal in size with greater than 50%  respiratory variability, suggesting right atrial pressure of 3 mmHg.  Physical Exam:   VS:  BP 112/70   Pulse (!) 58   Ht 5'  9 (1.753 m)   Wt 197 lb (89.4 kg)   SpO2 99%   BMI 29.09 kg/m    Wt Readings from Last 3 Encounters:  02/27/24 197 lb (89.4 kg)  02/21/23 191 lb (86.6 kg)  03/25/22 194 lb (88 kg)    GEN: Well nourished, well developed in no acute distress NECK: No JVD; No carotid bruits CARDIAC: RRR, no murmurs, rubs, gallops RESPIRATORY:  Clear to auscultation without rales, wheezing or rhonchi  ABDOMEN: Soft, non-tender, non-distended EXTREMITIES:  No edema; No deformity  ASSESSMENT AND PLAN: .   Assessment and Plan    Paroxysmal  atrial fibrillation and flutter, status post ablation Status post ablation in 2022 with no recurrence. Asymptomatic. Discussed potential Eliquis  discontinuation due to stability. Electrophysiologist consultation pending. - Continue Eliquis  5 mg BID. - Consulted with electrophysiologist regarding potential discontinuation of Eliquis . - Continue metoprolol  succinate 50 mg daily. - Schedule follow-up in one year.  Type 2 diabetes mellitus A1c improved to 7.2 from 7.7. On metformin, Jardiance, and Rybelsus 3 mg. No significant gastrointestinal side effects. - Continue current diabetes medications: metformin, Jardiance, and Rybelsus 3 mg.  Hypertension Well-controlled with current regimen. No recent angina or cardiovascular symptoms. - Continue current antihypertensive regimen.  Hyperlipidemia Managed with Lipitor 10 mg daily. LDL at 66, meeting goal. - Continue Lipitor 10 mg daily.  Minimal coronary artery calcification Coronary calcium score of 81, 32nd percentile. No accelerated progression.      CBC checked today since he is on eliquis .         Follow-up: Return in about 1 year (around 02/26/2025).  Signed, Darryle DASEN. Barbaraann, MD, [facility]  [facility]  [address] [place], [place] [address] [phone]  9:20 AM

## 2024-02-27 ENCOUNTER — Ambulatory Visit: Payer: Self-pay | Admitting: Cardiovascular Disease

## 2024-02-27 ENCOUNTER — Ambulatory Visit: Attending: Cardiovascular Disease | Admitting: Cardiovascular Disease

## 2024-02-27 ENCOUNTER — Encounter: Payer: Self-pay | Admitting: Cardiovascular Disease

## 2024-02-27 VITALS — BP 112/70 | HR 58 | Ht 69.0 in | Wt 197.0 lb

## 2024-02-27 DIAGNOSIS — E782 Mixed hyperlipidemia: Secondary | ICD-10-CM | POA: Diagnosis not present

## 2024-02-27 DIAGNOSIS — I10 Essential (primary) hypertension: Secondary | ICD-10-CM | POA: Diagnosis not present

## 2024-02-27 DIAGNOSIS — R931 Abnormal findings on diagnostic imaging of heart and coronary circulation: Secondary | ICD-10-CM | POA: Diagnosis not present

## 2024-02-27 DIAGNOSIS — I480 Paroxysmal atrial fibrillation: Secondary | ICD-10-CM

## 2024-02-27 NOTE — Patient Instructions (Signed)
 Medication Instructions:  Your physician recommends that you continue on your current medications as directed. Please refer to the Current Medication list given to you today.  *If you need a refill on your cardiac medications before your next appointment, please call your pharmacy*  Lab Work: CBC today at [facility] If you have labs (blood work) drawn today and your tests are completely normal, you will receive your results only by: MyChart Message (if you have MyChart) OR A paper copy in the mail If you have any lab test that is abnormal or we need to change your treatment, we will call you to review the results.  Testing/Procedures: NONE  Follow-Up: At [facility], you and your health needs are our priority.  As part of our continuing mission to provide you with exceptional heart care, our providers are all part of one team.  This team includes your primary Cardiologist (physician) and Advanced Practice Providers or APPs (Physician Assistants and Nurse Practitioners) who all work together to provide you with the care you need, when you need it.  Your next appointment:   1 year(s)  Provider:   O'Neal, MD  We recommend signing up for the patient portal called MyChart.  Sign up information is provided on this After Visit Summary.  MyChart is used to connect with patients for Virtual Visits (Telemedicine).  Patients are able to view lab/test results, encounter notes, upcoming appointments, etc.  Non-urgent messages can be sent to your provider as well.   To learn more about what you can do with MyChart, go to [email or web address].

## 2024-02-28 ENCOUNTER — Ambulatory Visit: Payer: Self-pay | Admitting: Cardiovascular Disease

## 2024-02-28 LAB — CBC
Hematocrit: 45.9 % (ref 37.5–51.0)
Hemoglobin: 15.2 g/dL (ref 13.0–17.7)
MCH: 30.8 pg (ref 26.6–33.0)
MCHC: 33.1 g/dL (ref 31.5–35.7)
MCV: 93 fL (ref 79–97)
Platelets: 225 x10E3/uL (ref 150–450)
RBC: 4.93 x10E6/uL (ref 4.14–5.80)
RDW: 12.7 % (ref 11.6–15.4)
WBC: 4.5 x10E3/uL (ref 3.4–10.8)

## 2024-03-22 ENCOUNTER — Other Ambulatory Visit: Payer: Self-pay | Admitting: Cardiovascular Disease

## 2024-03-22 DIAGNOSIS — I480 Paroxysmal atrial fibrillation: Secondary | ICD-10-CM

## 2024-03-22 DIAGNOSIS — I4892 Unspecified atrial flutter: Secondary | ICD-10-CM
